# Patient Record
Sex: Female | Born: 1995 | Race: Black or African American | Hispanic: Yes | Marital: Single | Smoking: Never smoker
Health system: Southern US, Community
[De-identification: ages and names within clinical notes are randomized; demographics above are authoritative.]

## PROBLEM LIST (undated history)

## (undated) DIAGNOSIS — R569 Unspecified convulsions: Secondary | ICD-10-CM

## (undated) DIAGNOSIS — G43909 Migraine, unspecified, not intractable, without status migrainosus: Secondary | ICD-10-CM

## (undated) DIAGNOSIS — N809 Endometriosis, unspecified: Secondary | ICD-10-CM

## (undated) DIAGNOSIS — D259 Leiomyoma of uterus, unspecified: Secondary | ICD-10-CM

## (undated) DIAGNOSIS — J45909 Unspecified asthma, uncomplicated: Secondary | ICD-10-CM

## (undated) DIAGNOSIS — N920 Excessive and frequent menstruation with regular cycle: Secondary | ICD-10-CM

## (undated) DIAGNOSIS — Z87898 Personal history of other specified conditions: Secondary | ICD-10-CM

## (undated) HISTORY — DX: Endometriosis, unspecified: N80.9

## (undated) HISTORY — PX: WISDOM TOOTH EXTRACTION: SHX21

---
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
## 2014-01-18 ENCOUNTER — Emergency Department (INDEPENDENT_AMBULATORY_CARE_PROVIDER_SITE_OTHER)
Admission: EM | Admit: 2014-01-18 | Discharge: 2014-01-18 | Disposition: A | Discharge status: Home / Self Care | Payer: Self-pay | Source: Home / Self Care | Attending: Family Medicine | Admitting: Family Medicine

## 2014-01-18 ENCOUNTER — Encounter (HOSPITAL_COMMUNITY): Payer: Self-pay | Admitting: Emergency Medicine

## 2014-01-18 ENCOUNTER — Other Ambulatory Visit (HOSPITAL_COMMUNITY)
Admission: RE | Admit: 2014-01-18 | Discharge: 2014-01-18 | Disposition: A | Discharge status: Home / Self Care | Payer: Medicaid Other | Source: Ambulatory Visit | Attending: Emergency Medicine | Admitting: Emergency Medicine

## 2014-01-18 DIAGNOSIS — N760 Acute vaginitis: Secondary | ICD-10-CM | POA: Insufficient documentation

## 2014-01-18 DIAGNOSIS — Z113 Encounter for screening for infections with a predominantly sexual mode of transmission: Secondary | ICD-10-CM | POA: Insufficient documentation

## 2014-01-18 LAB — POCT URINALYSIS DIP (DEVICE)
Bilirubin Urine: NEGATIVE
Glucose, UA: NEGATIVE mg/dL
Hgb urine dipstick: NEGATIVE
Ketones, ur: NEGATIVE mg/dL
Leukocytes, UA: NEGATIVE
Nitrite: NEGATIVE
Protein, ur: NEGATIVE mg/dL
Specific Gravity, Urine: 1.015 (ref 1.005–1.030)
Urobilinogen, UA: 0.2 mg/dL (ref 0.0–1.0)
pH: 8.5 — ABNORMAL HIGH (ref 5.0–8.0)

## 2014-01-18 LAB — POCT PREGNANCY, URINE: Preg Test, Ur: NEGATIVE

## 2014-01-18 MED ORDER — FLUCONAZOLE 150 MG PO TABS: 150.0000 mg | ORAL_TABLET | Freq: Once | ORAL | Status: DC

## 2014-01-18 NOTE — ED Provider Notes (Signed)
Medical screening examination/treatment/procedure(s) were performed by non-physician practitioner and as supervising physician I was immediately available for consultation/collaboration.  Philipp Deputy, M.D.  Harden Mo, MD 01/18/14 2224

## 2014-01-18 NOTE — ED Notes (Signed)
Patient complains itching with discharge from vagina that started 2 weeks ago.

## 2014-01-18 NOTE — ED Provider Notes (Signed)
CSN: [id number]     Arrival date & time 01/18/14  1643 History   First MD Initiated Contact with Patient 01/18/14 1801     Chief Complaint  Patient presents with  . Vaginitis   (Consider location/radiation/quality/duration/timing/severity/associated sxs/prior Treatment) HPI Comments: 18 year old female presents complaining of vaginal discharge and irritation for the past 2 weeks. She has had a yeast infection in the past as well as BV, she is not sure which this might be. She denies any risk for STDs. No abdominal pain. She is sexually active. No pelvic pain   Past Medical History  Diagnosis Date  . Asthma    History reviewed. No pertinent past surgical history. No family history on file. History  Substance Use Topics  . Smoking status: Never Smoker   . Smokeless tobacco: Not on file  . Alcohol Use: No   OB History   Grav Para Term Preterm Abortions TAB SAB Ect Mult Living                 Review of Systems  Genitourinary: Positive for vaginal discharge.       Vaginal irritation    Allergies  Review of patient's allergies indicates no known allergies.  Home Medications   Prior to Admission medications   Medication Sig Start Date End Date Taking? Authorizing Provider  albuterol (PROVENTIL HFA;VENTOLIN HFA) 108 (90 BASE) MCG/ACT inhaler Inhale into the lungs every 6 (six) hours as needed for wheezing or shortness of breath.   Yes Historical Provider, MD  fluconazole (DIFLUCAN) 150 MG tablet Take 1 tablet (150 mg total) by mouth once. Pick up the refill and and take second dose in 5 days if symptoms have not resolved 01/18/14   Liam Graham, PA-C   BP 126/67  Pulse 64  Temp(Src) 97.9 F (36.6 C) (Oral)  Resp 12  SpO2 100%  LMP 12/22/2013 Physical Exam  Nursing note and vitals reviewed. Constitutional: She is oriented to person, place, and time. Vital signs are normal. She appears well-developed and well-nourished. No distress.  HENT:  Head: Normocephalic and  atraumatic.  Pulmonary/Chest: Effort normal. No respiratory distress.  Abdominal: Soft. She exhibits no mass. There is no tenderness. There is no rebound and no guarding.  Genitourinary: There is no tenderness or lesion on the right labia. There is no tenderness or lesion on the left labia. Cervix exhibits no discharge and no friability. No tenderness or bleeding around the vagina. Vaginal discharge (thin white and clumpy) found.  Lymphadenopathy:       Right: No inguinal adenopathy present.       Left: No inguinal adenopathy present.  Neurological: She is alert and oriented to person, place, and time. She has normal strength. Coordination normal.  Skin: Skin is warm and dry. No rash noted. She is not diaphoretic.  Psychiatric: She has a normal mood and affect. Judgment normal.    ED Course  Procedures (including critical care time) Labs Review Labs Reviewed  POCT URINALYSIS DIP (DEVICE) - Abnormal; Notable for the following:    pH 8.5 (*)    All other components within normal limits  POCT PREGNANCY, URINE  CERVICOVAGINAL ANCILLARY ONLY    Imaging Review No results found.   MDM   1. Vaginitis    Treat for yeast.  Labs sent.  F/u PRN    Discharge Medication List as of 01/18/2014  6:42 PM    START taking these medications   Details  fluconazole (DIFLUCAN) 150 MG tablet Take 1 tablet (  150 mg total) by mouth once. Pick up the refill and and take second dose in 5 days if symptoms have not resolved, Starting 01/18/2014, Print         Liam Graham, PA-C 01/18/14 2146

## 2014-01-18 NOTE — Discharge Instructions (Signed)
Vaginitis Vaginitis is an inflammation of the vagina. It is most often caused by a change in the normal balance of the bacteria and yeast that live in the vagina. This change in balance causes an overgrowth of certain bacteria or yeast, which causes the inflammation. There are different types of vaginitis, but the most common types are:  Bacterial vaginosis.  Yeast infection (candidiasis).  Trichomoniasis vaginitis. This is a sexually transmitted infection (STI).  Viral vaginitis.  Atropic vaginitis.  Allergic vaginitis. CAUSES  The cause depends on the type of vaginitis. Vaginitis can be caused by:  Bacteria (bacterial vaginosis).  Yeast (yeast infection).  A parasite (trichomoniasis vaginitis)  A virus (viral vaginitis).  Low hormone levels (atrophic vaginitis). Low hormone levels can occur during pregnancy, breastfeeding, or after menopause.  Irritants, such as bubble baths, scented tampons, and feminine sprays (allergic vaginitis). Other factors can change the normal balance of the yeast and bacteria that live in the vagina. These include:  Antibiotic medicines.  Poor hygiene.  Diaphragms, vaginal sponges, spermicides, birth control pills, and intrauterine devices (IUD).  Sexual intercourse.  Infection.  Uncontrolled diabetes.  A weakened immune system. SYMPTOMS  Symptoms can vary depending on the cause of the vaginitis. Common symptoms include:  Abnormal vaginal discharge.  The discharge is white, gray, or yellow with bacterial vaginosis.  The discharge is thick, white, and cheesy with a yeast infection.  The discharge is frothy and yellow or greenish with trichomoniasis.  A bad vaginal odor.  The odor is fishy with bacterial vaginosis.  Vaginal itching, pain, or swelling.  Painful intercourse.  Pain or burning when urinating. Sometimes, there are no symptoms. TREATMENT  Treatment will vary depending on the type of infection.   Bacterial  vaginosis and trichomoniasis are often treated with antibiotic creams or pills.  Yeast infections are often treated with antifungal medicines, such as vaginal creams or suppositories.  Viral vaginitis has no cure, but symptoms can be treated with medicines that relieve discomfort. Your sexual partner should be treated as well.  Atrophic vaginitis may be treated with an estrogen cream, pill, suppository, or vaginal ring. If vaginal dryness occurs, lubricants and moisturizing creams may help. You may be told to avoid scented soaps, sprays, or douches.  Allergic vaginitis treatment involves quitting the use of the product that is causing the problem. Vaginal creams can be used to treat the symptoms. HOME CARE INSTRUCTIONS   Take all medicines as directed by your caregiver.  Keep your genital area clean and dry. Avoid soap and only rinse the area with water.  Avoid douching. It can remove the healthy bacteria in the vagina.  Do not use tampons or have sexual intercourse until your vaginitis has been treated. Use sanitary pads while you have vaginitis.  Wipe from front to back. This avoids the spread of bacteria from the rectum to the vagina.  Let air reach your genital area.  Wear cotton underwear to decrease moisture buildup.  Avoid wearing underwear while you sleep until your vaginitis is gone.  Avoid tight pants and underwear or nylons without a cotton panel.  Take off wet clothing (especially bathing suits) as soon as possible.  Use mild, non-scented products. Avoid using irritants, such as:  Scented feminine sprays.  Fabric softeners.  Scented detergents.  Scented tampons.  Scented soaps or bubble baths.  Practice safe sex and use condoms. Condoms may prevent the spread of trichomoniasis and viral vaginitis. SEEK MEDICAL CARE IF:   You have abdominal pain.  You   have a fever or persistent symptoms for more than 2 3 days.  You have a fever and your symptoms suddenly  get worse. Document Released: 06/02/2007 Document Revised: 04/29/2012 Document Reviewed: 01/16/2012 Exit[employer] Patient Information 2014 [employer].  

## 2014-01-20 NOTE — ED Notes (Signed)
Final report of GC / STD testing available for review. Positive for yeast. Called patient, and discussed report, and advised to uses diflucan as directed

## 2015-09-28 ENCOUNTER — Emergency Department (HOSPITAL_COMMUNITY)
Admission: EM | Admit: 2015-09-28 | Discharge: 2015-09-28 | Disposition: A | Discharge status: Home / Self Care | Payer: BLUE CROSS/BLUE SHIELD | Attending: Emergency Medicine | Admitting: Emergency Medicine

## 2015-09-28 ENCOUNTER — Encounter (HOSPITAL_COMMUNITY): Payer: Self-pay

## 2015-09-28 DIAGNOSIS — R51 Headache: Secondary | ICD-10-CM | POA: Diagnosis present

## 2015-09-28 DIAGNOSIS — J45909 Unspecified asthma, uncomplicated: Secondary | ICD-10-CM | POA: Diagnosis not present

## 2015-09-28 DIAGNOSIS — G43809 Other migraine, not intractable, without status migrainosus: Secondary | ICD-10-CM | POA: Diagnosis not present

## 2015-09-28 DIAGNOSIS — Z79899 Other long term (current) drug therapy: Secondary | ICD-10-CM | POA: Insufficient documentation

## 2015-09-28 HISTORY — DX: Migraine, unspecified, not intractable, without status migrainosus: G43.909

## 2015-09-28 MED ORDER — SODIUM CHLORIDE 0.9 % IV SOLN
1000.0000 mL | INTRAVENOUS | Status: DC
  Administered 2015-09-28: 1000 mL via INTRAVENOUS

## 2015-09-28 MED ORDER — METOCLOPRAMIDE HCL 5 MG/ML IJ SOLN
10.0000 mg | Freq: Once | INTRAMUSCULAR | Status: AC
  Administered 2015-09-28: 10 mg via INTRAVENOUS
  Filled 2015-09-28: qty 2

## 2015-09-28 MED ORDER — SODIUM CHLORIDE 0.9 % IV SOLN
1000.0000 mL | Freq: Once | INTRAVENOUS | Status: AC
  Administered 2015-09-28: 1000 mL via INTRAVENOUS

## 2015-09-28 MED ORDER — IBUPROFEN 800 MG PO TABS: 800.0000 mg | ORAL_TABLET | Freq: Four times a day (QID) | ORAL | Status: DC | PRN

## 2015-09-28 MED ORDER — DIPHENHYDRAMINE HCL 25 MG PO TABS
25.0000 mg | ORAL_TABLET | Freq: Four times a day (QID) | ORAL | Status: DC | PRN
Start: 2015-09-28 — End: 2017-06-17

## 2015-09-28 MED ORDER — KETOROLAC TROMETHAMINE 30 MG/ML IJ SOLN
30.0000 mg | Freq: Once | INTRAMUSCULAR | Status: AC
  Administered 2015-09-28: 30 mg via INTRAVENOUS
  Filled 2015-09-28: qty 1

## 2015-09-28 MED ORDER — ONDANSETRON HCL 4 MG/2ML IJ SOLN
4.0000 mg | Freq: Once | INTRAMUSCULAR | Status: AC | PRN
  Administered 2015-09-28: 4 mg via INTRAVENOUS
  Filled 2015-09-28: qty 2

## 2015-09-28 MED ORDER — METOCLOPRAMIDE HCL 10 MG PO TABS: 10.0000 mg | ORAL_TABLET | Freq: Four times a day (QID) | ORAL | Status: DC | PRN

## 2015-09-28 MED ORDER — DIPHENHYDRAMINE HCL 50 MG/ML IJ SOLN
25.0000 mg | Freq: Once | INTRAMUSCULAR | Status: AC
  Administered 2015-09-28: 25 mg via INTRAVENOUS
  Filled 2015-09-28: qty 1

## 2015-09-28 NOTE — ED Provider Notes (Signed)
CSN: [id number]     Arrival date & time 09/28/15  0429 History   First MD Initiated Contact with Patient 09/28/15 [id number]     Chief Complaint  Patient presents with  . Headache  . Emesis     (Consider location/radiation/quality/duration/timing/severity/associated sxs/prior Treatment) HPI Patient reports sudden severe headache behind her right eye. Pain is sharp. She states that she got nausea and vomited twice. She reports that the vision was blurry when the headache started now has improved. She reports she has had some recent cold symptoms. No fevers or sore throat. Patient reports a history of migraine headaches for about 4 years. They are typically frontal. She states they're not usually this severe however. No associated neurologic symptoms or dysfunction. Past Medical History  Diagnosis Date  . Asthma   . Migraines    History reviewed. No pertinent past surgical history. No family history on file. Social History  Substance Use Topics  . Smoking status: Never Smoker   . Smokeless tobacco: None  . Alcohol Use: No   OB History    No data available     Review of Systems  10 Systems reviewed and are negative for acute change except as noted in the HPI.   Allergies  Review of patient's allergies indicates no known allergies.  Home Medications   Prior to Admission medications   Medication Sig Start Date End Date Taking? Authorizing Provider  albuterol (PROVENTIL HFA;VENTOLIN HFA) 108 (90 BASE) MCG/ACT inhaler Inhale into the lungs every 6 (six) hours as needed for wheezing or shortness of breath.   Yes Historical Provider, MD  diphenhydrAMINE (BENADRYL) 25 MG tablet Take 1 tablet (25 mg total) by mouth every 6 (six) hours as needed. With Reglan and ibuprofen for migraine headache. 09/28/15   Charlesetta Shanks, MD  fluconazole (DIFLUCAN) 150 MG tablet Take 1 tablet (150 mg total) by mouth once. Pick up the refill and and take second dose in 5 days if symptoms have not  resolved Patient not taking: Reported on 09/28/2015 01/18/14   Liam Graham, PA-C  ibuprofen (ADVIL,MOTRIN) 800 MG tablet Take 1 tablet (800 mg total) by mouth every 6 (six) hours as needed. 09/28/15   Charlesetta Shanks, MD  metoCLOPramide (REGLAN) 10 MG tablet Take 1 tablet (10 mg total) by mouth every 6 (six) hours as needed for nausea. With ibuprofen and benadryl for migraine 09/28/15   Charlesetta Shanks, MD   BP 132/73 mmHg  Pulse 74  Temp(Src) 98.1 F (36.7 C) (Oral)  Resp 16  Ht 5\' 4"  (1.626 m)  Wt 183 lb (83.008 kg)  BMI 31.40 kg/m2  SpO2 99%  LMP 09/01/2015 Physical Exam  Constitutional: She is oriented to person, place, and time. She appears well-developed and well-nourished.  Patient is alert and nontoxic. She is well appearance.  HENT:  Head: Normocephalic and atraumatic.  Right Ear: External ear normal.  Left Ear: External ear normal.  Nose: Nose normal.  Mouth/Throat: Oropharynx is clear and moist.  TMs normal.  Eyes: EOM are normal. Pupils are equal, round, and reactive to light.  No proptosis or scleral injection. Normal extraocular motions. Normal pupillary responses. Patient endorses tenderness to palpation in the brow and temple and the period or frontal region. No palpable abnormality  Neck: Neck supple.  Cardiovascular: Normal rate, regular rhythm, normal heart sounds and intact distal pulses.   Pulmonary/Chest: Effort normal and breath sounds normal.  Abdominal: Soft. Bowel sounds are normal. She exhibits no distension. There is no tenderness.  Musculoskeletal: Normal range of motion. She exhibits no edema.  Neurological: She is alert and oriented to person, place, and time. She has normal strength. No cranial nerve deficit. She exhibits normal muscle tone. Coordination normal. GCS eye subscore is 4. GCS verbal subscore is 5. GCS motor subscore is 6.  Skin: Skin is warm, dry and intact.  Psychiatric: She has a normal mood and affect.    ED Course  Procedures (including  critical care time) Labs Review Labs Reviewed - No data to display  Imaging Review No results found. I have personally reviewed and evaluated these images and lab results as part of my medical decision-making.   EKG Interpretation None     Recheck: 10:15 headache has resolved with migraine cocktail. She reports feeling much improved. MDM   Final diagnoses:  Other migraine without status migrainosus, not intractable   She presents with headache that is unilateral and sharp in nature. There is no associated neurologic dysfunction. Patient has prior history of migraines with frontal headache however she felt this was more severe than her typical headache. Her physical examination is normal. She is alert and well with no neurologic dysfunction. Eye examination is for normal pupillary responses, no injection and normal extraocular motions. She reports after the initial onset of headache her blurred vision resolved and vision normalized. No visual defect at this time. Patient does not have hypertension. She shows no signs of infectious illness. He described minor cold symptoms however there is no facial swelling or erythema and she has completely nontoxic appearance. ENT exam is normal. Patient had complete symptom resolution with migraine cocktail.    Charlesetta Shanks, MD 09/28/15 1034

## 2015-09-28 NOTE — Discharge Instructions (Signed)

## 2015-09-28 NOTE — ED Notes (Signed)
Pt here for headache that started at 0100 and vomiting with it. Has never had vomiting with headache before.

## 2015-12-21 DIAGNOSIS — N760 Acute vaginitis: Secondary | ICD-10-CM | POA: Diagnosis not present

## 2016-06-28 DIAGNOSIS — N899 Noninflammatory disorder of vagina, unspecified: Secondary | ICD-10-CM | POA: Diagnosis not present

## 2016-08-28 DIAGNOSIS — B882 Other arthropod infestations: Secondary | ICD-10-CM | POA: Diagnosis not present

## 2016-09-17 DIAGNOSIS — Z23 Encounter for immunization: Secondary | ICD-10-CM | POA: Diagnosis not present

## 2016-09-19 DIAGNOSIS — B373 Candidiasis of vulva and vagina: Secondary | ICD-10-CM | POA: Diagnosis not present

## 2016-09-19 DIAGNOSIS — Z118 Encounter for screening for other infectious and parasitic diseases: Secondary | ICD-10-CM | POA: Diagnosis not present

## 2016-09-19 DIAGNOSIS — Z113 Encounter for screening for infections with a predominantly sexual mode of transmission: Secondary | ICD-10-CM | POA: Diagnosis not present

## 2016-09-19 DIAGNOSIS — Z114 Encounter for screening for human immunodeficiency virus [HIV]: Secondary | ICD-10-CM | POA: Diagnosis not present

## 2016-09-19 DIAGNOSIS — K921 Melena: Secondary | ICD-10-CM | POA: Diagnosis not present

## 2016-09-20 DIAGNOSIS — B373 Candidiasis of vulva and vagina: Secondary | ICD-10-CM | POA: Diagnosis not present

## 2016-09-20 DIAGNOSIS — K921 Melena: Secondary | ICD-10-CM | POA: Diagnosis not present

## 2016-11-28 ENCOUNTER — Other Ambulatory Visit: Payer: Self-pay | Admitting: Family

## 2016-11-28 DIAGNOSIS — N939 Abnormal uterine and vaginal bleeding, unspecified: Secondary | ICD-10-CM

## 2016-11-28 DIAGNOSIS — Z113 Encounter for screening for infections with a predominantly sexual mode of transmission: Secondary | ICD-10-CM | POA: Diagnosis not present

## 2016-12-03 ENCOUNTER — Ambulatory Visit
Admission: RE | Admit: 2016-12-03 | Discharge: 2016-12-03 | Disposition: A | Discharge status: Home / Self Care | Payer: BLUE CROSS/BLUE SHIELD | Source: Ambulatory Visit | Attending: Family | Admitting: Family

## 2016-12-03 ENCOUNTER — Other Ambulatory Visit: Payer: BLUE CROSS/BLUE SHIELD

## 2016-12-03 DIAGNOSIS — N939 Abnormal uterine and vaginal bleeding, unspecified: Secondary | ICD-10-CM | POA: Diagnosis not present

## 2017-01-02 DIAGNOSIS — N9489 Other specified conditions associated with female genital organs and menstrual cycle: Secondary | ICD-10-CM | POA: Diagnosis not present

## 2017-01-02 DIAGNOSIS — Z6831 Body mass index (BMI) 31.0-31.9, adult: Secondary | ICD-10-CM | POA: Diagnosis not present

## 2017-01-02 DIAGNOSIS — N920 Excessive and frequent menstruation with regular cycle: Secondary | ICD-10-CM | POA: Diagnosis not present

## 2017-01-02 DIAGNOSIS — D649 Anemia, unspecified: Secondary | ICD-10-CM | POA: Diagnosis not present

## 2017-01-22 DIAGNOSIS — D250 Submucous leiomyoma of uterus: Secondary | ICD-10-CM | POA: Diagnosis not present

## 2017-01-22 DIAGNOSIS — N840 Polyp of corpus uteri: Secondary | ICD-10-CM | POA: Diagnosis not present

## 2017-01-22 DIAGNOSIS — N920 Excessive and frequent menstruation with regular cycle: Secondary | ICD-10-CM | POA: Diagnosis not present

## 2017-02-16 HISTORY — PX: OTHER SURGICAL HISTORY: SHX169

## 2017-02-17 DIAGNOSIS — N920 Excessive and frequent menstruation with regular cycle: Secondary | ICD-10-CM | POA: Diagnosis not present

## 2017-03-11 DIAGNOSIS — Z3202 Encounter for pregnancy test, result negative: Secondary | ICD-10-CM | POA: Diagnosis not present

## 2017-03-11 DIAGNOSIS — N920 Excessive and frequent menstruation with regular cycle: Secondary | ICD-10-CM | POA: Diagnosis not present

## 2017-03-11 DIAGNOSIS — Z30430 Encounter for insertion of intrauterine contraceptive device: Secondary | ICD-10-CM | POA: Diagnosis not present

## 2017-03-11 DIAGNOSIS — N840 Polyp of corpus uteri: Secondary | ICD-10-CM | POA: Diagnosis not present

## 2017-03-31 DIAGNOSIS — N898 Other specified noninflammatory disorders of vagina: Secondary | ICD-10-CM | POA: Diagnosis not present

## 2017-03-31 DIAGNOSIS — N760 Acute vaginitis: Secondary | ICD-10-CM | POA: Diagnosis not present

## 2017-03-31 DIAGNOSIS — Z3200 Encounter for pregnancy test, result unknown: Secondary | ICD-10-CM | POA: Diagnosis not present

## 2017-03-31 DIAGNOSIS — R109 Unspecified abdominal pain: Secondary | ICD-10-CM | POA: Diagnosis not present

## 2017-03-31 DIAGNOSIS — Z09 Encounter for follow-up examination after completed treatment for conditions other than malignant neoplasm: Secondary | ICD-10-CM | POA: Diagnosis not present

## 2017-03-31 DIAGNOSIS — D259 Leiomyoma of uterus, unspecified: Secondary | ICD-10-CM | POA: Diagnosis not present

## 2017-05-20 DIAGNOSIS — Z7251 High risk heterosexual behavior: Secondary | ICD-10-CM | POA: Diagnosis not present

## 2017-05-20 DIAGNOSIS — N938 Other specified abnormal uterine and vaginal bleeding: Secondary | ICD-10-CM | POA: Diagnosis not present

## 2017-06-03 DIAGNOSIS — D259 Leiomyoma of uterus, unspecified: Secondary | ICD-10-CM | POA: Diagnosis not present

## 2017-06-03 DIAGNOSIS — N926 Irregular menstruation, unspecified: Secondary | ICD-10-CM | POA: Diagnosis not present

## 2017-06-03 DIAGNOSIS — N920 Excessive and frequent menstruation with regular cycle: Secondary | ICD-10-CM | POA: Diagnosis not present

## 2017-06-03 DIAGNOSIS — Z30431 Encounter for routine checking of intrauterine contraceptive device: Secondary | ICD-10-CM | POA: Diagnosis not present

## 2017-06-09 ENCOUNTER — Encounter (HOSPITAL_COMMUNITY): Payer: Self-pay | Admitting: Emergency Medicine

## 2017-06-09 DIAGNOSIS — Z975 Presence of (intrauterine) contraceptive device: Secondary | ICD-10-CM | POA: Diagnosis not present

## 2017-06-09 DIAGNOSIS — N939 Abnormal uterine and vaginal bleeding, unspecified: Secondary | ICD-10-CM | POA: Insufficient documentation

## 2017-06-09 DIAGNOSIS — R109 Unspecified abdominal pain: Secondary | ICD-10-CM | POA: Diagnosis not present

## 2017-06-09 DIAGNOSIS — J45909 Unspecified asthma, uncomplicated: Secondary | ICD-10-CM | POA: Diagnosis not present

## 2017-06-09 DIAGNOSIS — D259 Leiomyoma of uterus, unspecified: Secondary | ICD-10-CM | POA: Insufficient documentation

## 2017-06-09 LAB — COMPREHENSIVE METABOLIC PANEL
ALK PHOS: 50 U/L (ref 38–126)
ALT: 13 U/L — ABNORMAL LOW (ref 14–54)
ANION GAP: 8 (ref 5–15)
AST: 20 U/L (ref 15–41)
Albumin: 3.7 g/dL (ref 3.5–5.0)
BILIRUBIN TOTAL: 1.7 mg/dL — AB (ref 0.3–1.2)
BUN: 5 mg/dL — ABNORMAL LOW (ref 6–20)
CALCIUM: 8.7 mg/dL — AB (ref 8.9–10.3)
CO2: 25 mmol/L (ref 22–32)
Chloride: 104 mmol/L (ref 101–111)
Creatinine, Ser: 0.82 mg/dL (ref 0.44–1.00)
GFR calc Af Amer: >60 mL/min (ref >60)
Glucose, Bld: 148 mg/dL — ABNORMAL HIGH (ref 65–99)
POTASSIUM: 3.4 mmol/L — AB (ref 3.5–5.1)
Sodium: 137 mmol/L (ref 135–145)
TOTAL PROTEIN: 6.7 g/dL (ref 6.5–8.1)

## 2017-06-09 LAB — URINALYSIS, ROUTINE W REFLEX MICROSCOPIC
BACTERIA UA: NONE SEEN
BILIRUBIN URINE: NEGATIVE
GLUCOSE, UA: NEGATIVE mg/dL
Ketones, ur: NEGATIVE mg/dL
Nitrite: NEGATIVE
Protein, ur: NEGATIVE mg/dL
SPECIFIC GRAVITY, URINE: 1.012 (ref 1.005–1.030)
pH: 6 (ref 5.0–8.0)

## 2017-06-09 LAB — CBC
HCT: 38.1 % (ref 36.0–46.0)
HEMOGLOBIN: 13.1 g/dL (ref 12.0–15.0)
MCH: 30 pg (ref 26.0–34.0)
MCHC: 34.4 g/dL (ref 30.0–36.0)
MCV: 87.2 fL (ref 78.0–100.0)
Platelets: 280 10*3/uL (ref 150–400)
RBC: 4.37 MIL/uL (ref 3.87–5.11)
RDW: 12.2 % (ref 11.5–15.5)
WBC: 9.4 10*3/uL (ref 4.0–10.5)

## 2017-06-09 LAB — I-STAT BETA HCG BLOOD, ED (MC, WL, AP ONLY)

## 2017-06-09 LAB — LIPASE, BLOOD: Lipase: 33 U/L (ref 11–51)

## 2017-06-09 NOTE — ED Triage Notes (Signed)
Pt c/o heavy vaginal bleeding x 2 days. Reports having to use 1 pad/tampon per hour. Pt had an IUD placed in July and started on oral contraceptives at the beginning of October. Hx endometriosis and fibroids.

## 2017-06-10 ENCOUNTER — Emergency Department (HOSPITAL_COMMUNITY)
Admission: EM | Admit: 2017-06-10 | Discharge: 2017-06-10 | Disposition: A | Discharge status: Home / Self Care | Payer: BLUE CROSS/BLUE SHIELD | Attending: Emergency Medicine | Admitting: Emergency Medicine

## 2017-06-10 DIAGNOSIS — N939 Abnormal uterine and vaginal bleeding, unspecified: Secondary | ICD-10-CM

## 2017-06-10 NOTE — ED Notes (Signed)
Patient able to ambulate independently  

## 2017-06-10 NOTE — ED Provider Notes (Signed)
[facility] EMERGENCY DEPARTMENT Provider Note   CSN: [id number] Arrival date & time: 06/09/17  1905     History   Chief Complaint Chief Complaint  Patient presents with  . Vaginal Bleeding    HPI Ann Fields is a 21 y.o. female.  The history is provided by the patient and medical records.    21 year old female with history of asthma, migraines, uterine fibroids and dysfunctional uterine bleeding, presenting to the ED with vaginal bleeding. States her menstrual cycle started on Saturday and has been heavier than normal. She reports she is using a pad or tampon every hour. States large amount of blood in the commode with some clots intermixed. She does report some lower abdominal cramping which is typical during her cycle. States she has been following with her GYN about this, currently has IUD which was supposed to control the bleeding, however reports it actually made it worse. States they also put her on oral birth control pills, this is her first cycle while taking the and feels it is worse than ever. She did call her gynecologist last week about her options as these regimens do not seem to be working, she was told she could get her IUD taken out but she did not want to pursue that at that time. She's not had any fever or chills. No urinary symptoms. No vaginal discharge. She is not had any dizziness, lightheadedness, or syncopal events.  Past Medical History:  Diagnosis Date  . Asthma   . Migraines     There are no active problems to display for this patient.   History reviewed. No pertinent surgical history.  OB History    No data available       Home Medications    Prior to Admission medications   Medication Sig Start Date End Date Taking? Authorizing Provider  albuterol (PROVENTIL HFA;VENTOLIN HFA) 108 (90 BASE) MCG/ACT inhaler Inhale into the lungs every 6 (six) hours as needed for wheezing or shortness of breath.    [provider]    diphenhydrAMINE (BENADRYL) 25 MG tablet Take 1 tablet (25 mg total) by mouth every 6 (six) hours as needed. With Reglan and ibuprofen for migraine headache. 09/28/15   Charlesetta Shanks, MD  fluconazole (DIFLUCAN) 150 MG tablet Take 1 tablet (150 mg total) by mouth once. Pick up the refill and and take second dose in 5 days if symptoms have not resolved Patient not taking: Reported on 09/28/2015 01/18/14   Liam Graham, PA-C  ibuprofen (ADVIL,MOTRIN) 800 MG tablet Take 1 tablet (800 mg total) by mouth every 6 (six) hours as needed. 09/28/15   Charlesetta Shanks, MD  metoCLOPramide (REGLAN) 10 MG tablet Take 1 tablet (10 mg total) by mouth every 6 (six) hours as needed for nausea. With ibuprofen and benadryl for migraine 09/28/15   Charlesetta Shanks, MD    Family History No family history on file.  Social History Social History  Substance Use Topics  . Smoking status: Never Smoker  . Smokeless tobacco: Never Used  . Alcohol use No     Allergies   Patient has no known allergies.   Review of Systems Review of Systems  Genitourinary: Positive for vaginal bleeding.  All other systems reviewed and are negative.    Physical Exam Updated Vital Signs BP (!) 142/81 (BP Location: Right Arm)   Pulse 77   Temp 98.9 F (37.2 C) (Oral)   Resp 16   Ht 5\' 5"  (1.651 m)  Wt 86.2 kg (190 lb)   SpO2 99%   BMI 31.62 kg/m   Physical Exam  Constitutional: She is oriented to person, place, and time. She appears well-developed and well-nourished.  HENT:  Head: Normocephalic and atraumatic.  Mouth/Throat: Oropharynx is clear and moist.  Eyes: Pupils are equal, round, and reactive to light. Conjunctivae and EOM are normal.  Neck: Normal range of motion.  Cardiovascular: Normal rate, regular rhythm and normal heart sounds.   Pulmonary/Chest: Effort normal and breath sounds normal. No respiratory distress. She has no wheezes.  Abdominal: Soft. Bowel sounds are normal. There is no tenderness. There is no  rebound.  Musculoskeletal: Normal range of motion.  Neurological: She is alert and oriented to person, place, and time.  Skin: Skin is warm and dry.  Psychiatric: She has a normal mood and affect.  Nursing note and vitals reviewed.    ED Treatments / Results  Labs (all labs ordered are listed, but only abnormal results are displayed) Labs Reviewed  COMPREHENSIVE METABOLIC PANEL - Abnormal; Notable for the following:       Result Value   Potassium 3.4 (*)    Glucose, Bld 148 (*)    BUN 5 (*)    Calcium 8.7 (*)    ALT 13 (*)    Total Bilirubin 1.7 (*)    All other components within normal limits  URINALYSIS, ROUTINE W REFLEX MICROSCOPIC - Abnormal; Notable for the following:    Hgb urine dipstick LARGE (*)    Leukocytes, UA TRACE (*)    Squamous Epithelial / LPF 0-5 (*)    All other components within normal limits  LIPASE, BLOOD  CBC  I-STAT BETA HCG BLOOD, ED ([facility], [facility], [facility] ONLY)    EKG  EKG Interpretation None       Radiology No results found.  Procedures Procedures (including critical care time)  Medications Ordered in ED Medications - No data to display   Initial Impression / Assessment and Plan / ED Course  I have reviewed the triage vital signs and the nursing notes.  Pertinent labs & imaging results that were available during my care of the patient were reviewed by me and considered in my medical decision making (see chart for details).  21 year old female here with vaginal bleeding. She has a history of uterine fibroids as well as dysfunctional uterine bleeding. Currently has IUD is also taking oral birth control pills. Saw GYN last week as well. She is afebrile and nontoxic. Hemodynamically stable. Screening labs overall reassuring. H&H is stable. Pregnancy is negative. UA without signs of infection. I discussed with patient that giving this is been a long-standing issue for her that has been previously managed by her GYN, I have limited options for  intervention at this time. She does not have any expressed concern for STD does not want screening test done for this. In lieu of this, we will not perform pelvic exam here-- patient comfortable with this. I strongly encouraged her to follow-up with her GYN for ongoing care. She will call their office in the morning.  School note given.  Discussed plan with patient, she acknowledged understanding and agreed with plan of care.  Return precautions given for new or worsening symptoms.  Final Clinical Impressions(s) / ED Diagnoses   Final diagnoses:  Vaginal bleeding    New Prescriptions New Prescriptions   No medications on file     Larene Pickett, PA-C 06/10/17 6144    Merryl Hacker, MD 06/11/17 [phone]

## 2017-06-10 NOTE — Discharge Instructions (Signed)
Follow-up with your OB-GYN. Return here for any new concerns.

## 2017-06-17 ENCOUNTER — Encounter: Payer: Self-pay | Admitting: Gynecology

## 2017-06-17 ENCOUNTER — Ambulatory Visit (INDEPENDENT_AMBULATORY_CARE_PROVIDER_SITE_OTHER): Payer: BLUE CROSS/BLUE SHIELD | Admitting: Gynecology

## 2017-06-17 VITALS — BP 118/76 | Ht 65.0 in | Wt 193.0 lb

## 2017-06-17 DIAGNOSIS — R102 Pelvic and perineal pain: Secondary | ICD-10-CM

## 2017-06-17 DIAGNOSIS — D250 Submucous leiomyoma of uterus: Secondary | ICD-10-CM | POA: Diagnosis not present

## 2017-06-17 DIAGNOSIS — Z30432 Encounter for removal of intrauterine contraceptive device: Secondary | ICD-10-CM | POA: Diagnosis not present

## 2017-06-17 DIAGNOSIS — N926 Irregular menstruation, unspecified: Secondary | ICD-10-CM

## 2017-06-17 NOTE — Patient Instructions (Signed)
Follow up for ultrasound as scheduled 

## 2017-06-17 NOTE — Progress Notes (Signed)
    GENEIEVE DUELL [birth year] [id number]        21 y.o.  G0P0000 new patient who presents complaining of irregular bleeding and pelvic pain.  History of heavier menses starting earlier this year.  Had ultrasound which suggested a 1.3 cm solid hypoechoic mass within the endometrial echo complex with a feeder vessel.  Subsequently had office hysteroscopy with the preoperative diagnosis of endometrial polyp.  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  Endometrial biopsy showed secratory endometrium.  Patient had Liletta IUD placed in July.  She has continued to have irregular bleeding with significant dysmenorrhea since then bleeding for weeks on and.  Recently evaluated within the emergency room.  Hemoglobin 13- hCG and was started on oral contraceptives over the last several weeks.  Patient notes that her bleeding has continued on and off daily.  Reports recent STD screening through clinic at school.  Reports negative Pap smear 2017.  Currently not sexually active.  Past medical history,surgical history, problem list, medications, allergies, family history and social history were all reviewed and documented in the EPIC chart.  Directed ROS with pertinent positives and negatives documented in the history of present illness/assessment and plan.  Exam: Caryn Bee assistant Vitals:   06/17/17 1400  BP: 118/76  Weight: 193 lb (87.5 kg)  Height: 5\' 5"  (1.651 m)   General appearance:  Normal Abdomen soft nontender without masses guarding rebound Pelvic external BUS vagina with bloody discharge.  Cervix grossly normal, IUD string visualized.  Uterus anteverted normal size midline mobile nontender.  Adnexa without masses or tenderness.  Procedure: IUD string was grasped with a [place] forcep and her IUD was removed, shown to the patient and discarded.  Of note the length of string from where I grasped it at the external loss and from the lower portion of the IUD was  short to suggest that the IUD was within at the cervical canal/lower uterine segment  Assessment/Plan:  21 y.o. G0P0000 with irregular bleeding, IUD in place, recent initiation of oral contraceptives to control her bleeding with ultrasound suggesting endometrial defect with subsequent hysteroscopy showing submucous myoma.  Various options were reviewed with the patient and her grandmother.  My recommendation would be to remove the IUD now cycle 1 or 2 months and then to repeat the sonohysterogram to reevaluate the myoma.  If still with significant submucous component and particularly if irregular bleeding continues then proceed with submucous myomectomy.  I had a lengthy discussion as far as myomas, etiology and potential issues.  I also reviewed submucous myoma could have a fertility issue as could hysteroscopic resection with subsequent scarring.  Options of no intervention even if submucous myoma found versus resecting it all reviewed.  At this point patient wants her IUD removed which was accomplished as above, will stay on her oral contraceptives at her choice and monitor her bleeding.  She will follow-up regardless for a sonohysterogram in 1-2 months, sooner if significant bleeding or pain.  Greater than 50% of my time was spent in direct face to face counseling and coordination of care with the patient.     Anastasio Auerbach MD, 2:49 PM 06/17/2017

## 2017-08-17 ENCOUNTER — Ambulatory Visit (HOSPITAL_COMMUNITY)
Admission: EM | Admit: 2017-08-17 | Discharge: 2017-08-17 | Disposition: A | Discharge status: Home / Self Care | Payer: BLUE CROSS/BLUE SHIELD | Attending: Internal Medicine | Admitting: Internal Medicine

## 2017-08-17 ENCOUNTER — Other Ambulatory Visit: Payer: Self-pay

## 2017-08-17 ENCOUNTER — Encounter (HOSPITAL_COMMUNITY): Payer: Self-pay | Admitting: Emergency Medicine

## 2017-08-17 DIAGNOSIS — Z91040 Latex allergy status: Secondary | ICD-10-CM | POA: Diagnosis not present

## 2017-08-17 DIAGNOSIS — J45909 Unspecified asthma, uncomplicated: Secondary | ICD-10-CM | POA: Diagnosis not present

## 2017-08-17 DIAGNOSIS — R300 Dysuria: Secondary | ICD-10-CM

## 2017-08-17 DIAGNOSIS — N3090 Cystitis, unspecified without hematuria: Secondary | ICD-10-CM

## 2017-08-17 DIAGNOSIS — N898 Other specified noninflammatory disorders of vagina: Secondary | ICD-10-CM | POA: Diagnosis not present

## 2017-08-17 DIAGNOSIS — Z3202 Encounter for pregnancy test, result negative: Secondary | ICD-10-CM

## 2017-08-17 DIAGNOSIS — Z8742 Personal history of other diseases of the female genital tract: Secondary | ICD-10-CM | POA: Diagnosis not present

## 2017-08-17 LAB — POCT URINALYSIS DIP (DEVICE)
Bilirubin Urine: NEGATIVE
Glucose, UA: NEGATIVE mg/dL
Ketones, ur: NEGATIVE mg/dL
NITRITE: NEGATIVE
PROTEIN: NEGATIVE mg/dL
Specific Gravity, Urine: 1.025 (ref 1.005–1.030)
Urobilinogen, UA: 0.2 mg/dL (ref 0.0–1.0)
pH: 6 (ref 5.0–8.0)

## 2017-08-17 LAB — POCT PREGNANCY, URINE: PREG TEST UR: NEGATIVE

## 2017-08-17 MED ORDER — ACETAMINOPHEN 325 MG PO TABS
650.0000 mg | ORAL_TABLET | Freq: Once | ORAL | Status: AC
  Administered 2017-08-17: 650 mg via ORAL

## 2017-08-17 MED ORDER — ACYCLOVIR 400 MG PO TABS: 400.0000 mg | ORAL_TABLET | Freq: Three times a day (TID) | ORAL | 0 refills | Status: AC

## 2017-08-17 MED ORDER — CEFTRIAXONE SODIUM 250 MG IJ SOLR
INTRAMUSCULAR | Status: AC
  Filled 2017-08-17: qty 250

## 2017-08-17 MED ORDER — AZITHROMYCIN 250 MG PO TABS
ORAL_TABLET | ORAL | Status: AC
  Filled 2017-08-17: qty 4

## 2017-08-17 MED ORDER — ACETAMINOPHEN 325 MG PO TABS
ORAL_TABLET | ORAL | Status: AC
  Filled 2017-08-17: qty 2

## 2017-08-17 MED ORDER — METRONIDAZOLE 500 MG PO TABS: 500.0000 mg | ORAL_TABLET | Freq: Two times a day (BID) | ORAL | 0 refills | Status: DC

## 2017-08-17 MED ORDER — CEPHALEXIN 500 MG PO CAPS: 500.0000 mg | ORAL_CAPSULE | Freq: Two times a day (BID) | ORAL | 0 refills | Status: AC

## 2017-08-17 MED ORDER — AZITHROMYCIN 250 MG PO TABS
1000.0000 mg | ORAL_TABLET | Freq: Once | ORAL | Status: AC
  Administered 2017-08-17: 1000 mg via ORAL

## 2017-08-17 MED ORDER — CEFTRIAXONE SODIUM 250 MG IJ SOLR
250.0000 mg | Freq: Once | INTRAMUSCULAR | Status: AC
  Administered 2017-08-17: 250 mg via INTRAMUSCULAR

## 2017-08-17 MED ORDER — FLUCONAZOLE 150 MG PO TABS: 150.0000 mg | ORAL_TABLET | Freq: Every day | ORAL | 0 refills | Status: DC

## 2017-08-17 MED ORDER — LIDOCAINE HCL (PF) 1 % IJ SOLN
INTRAMUSCULAR | Status: AC
  Filled 2017-08-17: qty 2

## 2017-08-17 NOTE — ED Notes (Signed)
Urine in lab 

## 2017-08-17 NOTE — ED Triage Notes (Signed)
Vaginal irritation on Thursday, now reports a rash and tissue swelling and vaginal discharge, "watery, yellow, milky type discharge"

## 2017-08-17 NOTE — ED Notes (Signed)
Sent to bathroom for a dirty and clean specimen with instructions

## 2017-08-17 NOTE — ED Provider Notes (Signed)
[place]    CSN: [id number] Arrival date & time: 08/17/17  1811     History   Chief Complaint Chief Complaint  Patient presents with  . Vaginal Discharge    HPI Ann Fields is a 21 y.o. female.   21 year old female comes in for 4-day history of vaginal irritation, discharge.  She has also had dysuria, urinary urgency. No fever, but has had chills today.  Denies abdominal pain, nausea, vomiting.  Denies spotting.  Patient sexually active with one partner, 2 partners in the past month, occasional condom use.  Patient states that she is allergic to latex, and use condoms during recent sexual intercourse, which is when the irritation started.      Past Medical History:  Diagnosis Date  . Asthma   . Endometriosis   . Fibroid   . Migraines     There are no active problems to display for this patient.   Past Surgical History:  Procedure Laterality Date  . liletta     Inserted 03-11-17    OB History    Gravida Para Term Preterm AB Living   0 0 0 0 0 0   SAB TAB Ectopic Multiple Live Births   0 0 0 0 0       Home Medications    Prior to Admission medications   Medication Sig Start Date End Date Taking? Authorizing Provider  acyclovir (ZOVIRAX) 400 MG tablet Take 1 tablet (400 mg total) by mouth 3 (three) times daily for 7 days. 08/17/17 08/24/17  Tasia Catchings, Amy V, PA-C  albuterol (PROVENTIL HFA;VENTOLIN HFA) 108 (90 BASE) MCG/ACT inhaler Inhale into the lungs every 6 (six) hours as needed for wheezing or shortness of breath.    [provider]  cephALEXin (KEFLEX) 500 MG capsule Take 1 capsule (500 mg total) by mouth 2 (two) times daily for 7 days. 08/17/17 08/24/17  Ok Edwards, PA-C  ibuprofen (ADVIL,MOTRIN) 800 MG tablet Take 1 tablet (800 mg total) by mouth every 6 (six) hours as needed. 09/28/15   Charlesetta Shanks, MD    Family History Family History  Problem Relation Age of Onset  . Diabetes Paternal Grandmother     Social History Social  History   Tobacco Use  . Smoking status: Never Smoker  . Smokeless tobacco: Never Used  Substance Use Topics  . Alcohol use: Yes    Comment: Occas  . Drug use: No     Allergies   Patient has no known allergies.   Review of Systems Review of Systems  Reason unable to perform ROS: See HPI as above.     Physical Exam Triage Vital Signs ED Triage Vitals  Enc Vitals Group     BP 08/17/17 2008 (!) 147/90     Pulse Rate 08/17/17 2008 (!) 116     Resp 08/17/17 2008 20     Temp 08/17/17 2008 (!) 101.8 F (38.8 C)     Temp Source 08/17/17 2008 Oral     SpO2 08/17/17 2008 100 %     Weight --      Height --      Head Circumference --      Peak Flow --      Pain Score 08/17/17 2006 9     Pain Loc --      Pain Edu? --      Excl. in [place]? --    No data found.  Updated Vital Signs BP 135/84 (BP Location: Left Arm)  Pulse (!) 115   Temp (!) 102.8 F (39.3 C) (Oral)   Resp (!) 22   LMP 08/10/2017   SpO2 100%   Physical Exam  Constitutional: She is oriented to person, place, and time. She appears well-developed and well-nourished. No distress.  HENT:  Head: Normocephalic and atraumatic.  Eyes: Conjunctivae are normal. Pupils are equal, round, and reactive to light.  Cardiovascular: Normal rate, regular rhythm and normal heart sounds. Exam reveals no gallop and no friction rub.  No murmur heard. Pulmonary/Chest: Effort normal and breath sounds normal. She has no wheezes. She has no rales.  Abdominal: Soft. Bowel sounds are normal. She exhibits no mass. There is no tenderness. There is no rebound, no guarding and no CVA tenderness.  Genitourinary:  Genitourinary Comments: Speculum exam deferred by patient due to pain.  Redness around the labia, with tenderness to palpation.  Fishy odor smelled.  No sores seen.  Neurological: She is alert and oriented to person, place, and time.  Skin: Skin is warm and dry.  Psychiatric: She has a normal mood and affect. Her behavior is  normal. Judgment normal.     UC Treatments / Results  Labs (all labs ordered are listed, but only abnormal results are displayed) Labs Reviewed  POCT URINALYSIS DIP (DEVICE) - Abnormal; Notable for the following components:      Result Value   Hgb urine dipstick TRACE (*)    Leukocytes, UA MODERATE (*)    All other components within normal limits  URINE CULTURE  HSV 1 ANTIBODY, IGG  HSV 2 ANTIBODY, IGG  POCT PREGNANCY, URINE  CERVICOVAGINAL ANCILLARY ONLY    EKG  EKG Interpretation None       Radiology No results found.  Procedures Procedures (including critical care time)  Medications Ordered in UC Medications  acetaminophen (TYLENOL) tablet 650 mg (650 mg Oral Given 08/17/17 2015)  azithromycin (ZITHROMAX) tablet 1,000 mg (1,000 mg Oral Given 08/17/17 2153)  cefTRIAXone (ROCEPHIN) injection 250 mg (250 mg Intramuscular Given 08/17/17 2151)     Initial Impression / Assessment and Plan / UC Course  I have reviewed the triage vital signs and the nursing notes.  Pertinent labs & imaging results that were available during my care of the patient were reviewed by me and considered in my medical decision making (see chart for details).    Urine dipstick positive for UTI. Start antibiotics as directed. Push fluids.   Repeat vitals with continued fever and tachycardia.  Given history and exam, will treat empirically for gonorrhea, chlamydia, herpes.  Azithromycin 1 g p.o. and Rocephin 250 mg IM given in office today.  Start acyclovir as directed.  Cytology and lab work sent, patient will be contacted with any positive results that require additional treatment. Patient to refrain from sexual activity for the next 7 days. Return precautions given.   Discussed case with Dr Valere Dross, who agrees to plan.   Final Clinical Impressions(s) / UC Diagnoses   Final diagnoses:  Cystitis  Vaginal discharge    ED Discharge Orders        Ordered    cephALEXin (KEFLEX) 500 MG  capsule  2 times daily     08/17/17 2038    fluconazole (DIFLUCAN) 150 MG tablet  Daily,   Status:  Discontinued     08/17/17 2038    metroNIDAZOLE (FLAGYL) 500 MG tablet  2 times daily,   Status:  Discontinued     08/17/17 2038    acyclovir (ZOVIRAX) 400 MG tablet  3 times daily     08/17/17 2146        Arturo Morton 08/17/17 2158

## 2017-08-17 NOTE — Discharge Instructions (Addendum)
Your urine was positive for an urinary tract infection. Start Keflex as directed. Keep hydrated, your urine should be clear to pale yellow in color. Monitor for any worsening of symptoms, fever, worsening abdominal pain, nausea/vomiting, flank pain, follow up for reevaluation.   You were treated empirically for gonorrhea, chlamydia, herpes.  Azithromycin 1 g and Rocephin injection 250 mg given in office today.  Start acyclovir as directed.  Testing sent, you will be contacted with any positive results that requires further treatment. Refrain from sexual activity for the next 7 days. Monitor for any worsening of symptoms, fever, abdominal pain, nausea, vomiting, to follow up for reevaluation.

## 2017-08-18 ENCOUNTER — Other Ambulatory Visit: Payer: Self-pay | Admitting: Gynecology

## 2017-08-18 DIAGNOSIS — N939 Abnormal uterine and vaginal bleeding, unspecified: Secondary | ICD-10-CM

## 2017-08-19 LAB — HSV 1 ANTIBODY, IGG: HSV 1 Glycoprotein G Ab, IgG: 1.1 index — ABNORMAL HIGH (ref 0.00–0.90)

## 2017-08-19 LAB — URINE CULTURE

## 2017-08-19 LAB — HSV 2 ANTIBODY, IGG: HSV 2 Glycoprotein G Ab, IgG: <0.91 index (ref 0.00–0.90)

## 2017-08-20 ENCOUNTER — Ambulatory Visit: Payer: BLUE CROSS/BLUE SHIELD | Admitting: Gynecology

## 2017-08-20 ENCOUNTER — Other Ambulatory Visit: Payer: BLUE CROSS/BLUE SHIELD

## 2017-08-20 LAB — CERVICOVAGINAL ANCILLARY ONLY
BACTERIAL VAGINITIS: POSITIVE — AB
Candida vaginitis: NEGATIVE
Chlamydia: NEGATIVE
Neisseria Gonorrhea: NEGATIVE
TRICH (WINDOWPATH): NEGATIVE

## 2017-08-21 ENCOUNTER — Telehealth (HOSPITAL_COMMUNITY): Payer: Self-pay | Admitting: *Deleted

## 2017-10-03 ENCOUNTER — Ambulatory Visit: Payer: BLUE CROSS/BLUE SHIELD | Admitting: Gynecology

## 2017-10-03 ENCOUNTER — Encounter: Payer: Self-pay | Admitting: Gynecology

## 2017-10-03 VITALS — BP 118/76

## 2017-10-03 DIAGNOSIS — A609 Anogenital herpesviral infection, unspecified: Secondary | ICD-10-CM

## 2017-10-03 DIAGNOSIS — N912 Amenorrhea, unspecified: Secondary | ICD-10-CM | POA: Diagnosis not present

## 2017-10-03 LAB — PREGNANCY, URINE: Preg Test, Ur: NEGATIVE

## 2017-10-03 MED ORDER — MEDROXYPROGESTERONE ACETATE 10 MG PO TABS: 10.0000 mg | ORAL_TABLET | Freq: Every day | ORAL | 0 refills | Status: DC

## 2017-10-03 NOTE — Progress Notes (Signed)
    Ann Fields 1996-01-07 [id number]        22 y.o.  G0P0000 presents late for her menses.  History of regular monthly menses through 08/08/2017 and no bleeding since.  She has not had intercourse since December.  Had Mirena IUD which was removed previously.  Was seen at the urgent care in December and was diagnosed with herpes.  Was given a short course of antiviral.  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  She was negative for HSV 2 IgG.  She did note that she had a area of small blisters.  Past medical history,surgical history, problem list, medications, allergies, family history and social history were all reviewed and documented in the EPIC chart.  Directed ROS with pertinent positives and negatives documented in the history of present illness/assessment and plan.  Exam: Caryn Bee assistant Vitals:   10/03/17 1017  BP: 118/76   General appearance:  Normal Abdomen soft nontender without masses guarding rebound Pelvic external BUS vagina normal.  Cervix normal.  Uterus normal size midline mobile nontender.  Adnexa without masses or tenderness.  Assessment/Plan:  22 y.o. G0P0000 with:  1. Skipped menses.  UPT negative today.  Denies intercourse since December.  Will withdrawal with Provera 10 mg x 10 days.  Reviewed the issues of anovulatory cycles.  As this is an isolated event will monitor afterwards and if she continues to have irregular bleeding will re-discuss treatment options.  At this point she is not interested in contraception stating "it does not agree with me".  She understands the risks of pregnancy at least using condoms. 2. 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  Clinically though she does describe areas of blisters which would go along with HSV.  At this point I recommend that she read present if any suspicion of lesions before  inspection and direct PCR testing of the area.  Possible daily suppressive doses also discussed if this becomes a recurrent issue.  Greater than 50% of my time was spent in direct face to face counseling and coordination of care with the patient.     Anastasio Auerbach MD, 10:49 AM 10/03/2017

## 2017-10-03 NOTE — Patient Instructions (Signed)
Take the progesterone medicine daily for 10 days to bring on your menses.  Call me if your menses continue irregular  Follow-up if you have any recurrence of the herpes.

## 2017-10-03 NOTE — Addendum Note (Signed)
Addended by: Nelva Nay on: 10/03/2017 11:12 AM   Modules accepted: Orders

## 2017-10-29 DIAGNOSIS — J029 Acute pharyngitis, unspecified: Secondary | ICD-10-CM | POA: Diagnosis not present

## 2017-12-08 ENCOUNTER — Other Ambulatory Visit: Payer: BLUE CROSS/BLUE SHIELD

## 2017-12-08 ENCOUNTER — Ambulatory Visit: Payer: BLUE CROSS/BLUE SHIELD | Admitting: Gynecology

## 2017-12-17 ENCOUNTER — Encounter: Payer: Self-pay | Admitting: Gynecology

## 2017-12-17 ENCOUNTER — Ambulatory Visit (INDEPENDENT_AMBULATORY_CARE_PROVIDER_SITE_OTHER): Payer: BLUE CROSS/BLUE SHIELD

## 2017-12-17 ENCOUNTER — Ambulatory Visit: Payer: BLUE CROSS/BLUE SHIELD | Admitting: Gynecology

## 2017-12-17 VITALS — BP 124/78

## 2017-12-17 DIAGNOSIS — N840 Polyp of corpus uteri: Secondary | ICD-10-CM

## 2017-12-17 DIAGNOSIS — D250 Submucous leiomyoma of uterus: Secondary | ICD-10-CM

## 2017-12-17 DIAGNOSIS — D261 Other benign neoplasm of corpus uteri: Secondary | ICD-10-CM | POA: Diagnosis not present

## 2017-12-17 DIAGNOSIS — N939 Abnormal uterine and vaginal bleeding, unspecified: Secondary | ICD-10-CM

## 2017-12-17 DIAGNOSIS — N921 Excessive and frequent menstruation with irregular cycle: Secondary | ICD-10-CM

## 2017-12-17 NOTE — Progress Notes (Signed)
    Ann Fields 07-Feb-1996 [id number]        22 y.o.  G0P0000 resents for sonohysterogram.  History of irregular bleeding and pelvic cramping.  Had an ultrasound elsewhere which suggested endometrial mass and ultimately underwent office hysteroscopy which showed a submucous myoma.  Endometrial biopsy was performed which showed secratory endometrium.  Patient follows up now for sonohysterogram due to continued irregular bleeding, pelvic cramping and evidence of endometrial mass on prior evaluation.  Past medical history,surgical history, problem list, medications, allergies, family history and social history were all reviewed and documented in the EPIC chart.  Directed ROS with pertinent positives and negatives documented in the history of present illness/assessment and plan.  Exam: [name] assistant Vitals:   12/17/17 1458  BP: 124/78   General appearance:  Normal Abdomen soft nontender without masses guarding rebound Pelvic external BUS vagina normal.  Cervix normal.  Uterus grossly normal midline mobile nontender.  Adnexa without masses or tenderness.  Ultrasound transvaginal and transabdominal shows uterus normal size and echotexture.  Endometrial echo 18.3 mm.  Vascular hypoechoic focus noted within the endometrium 25 x 18 mm.  Right and left ovaries visualized with physiologic changes.  Cul-de-sac negative.  Sono histogram performed, sterile technique, easy catheter introduction, adequate distention with 21 x 15 x 21 mm mass consistent with a submucous myoma.  A smaller echogenic focus noticed in the lower uterine segment 13 x 6 mm, questionable polyp.  Endometrial sample taken.  Patient tolerated well.  Assessment/Plan:  22 y.o. 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  We discussed options for management and my recommendation to proceed with hysteroscopy D&C with resection of the submucous myoma.  I also reviewed the possible  polyp in the lower uterine segment that we would also address at the same time.  I reviewed with the patient and her mother what is involved with the procedure to include the intraoperative and postoperative courses.  She understands that we may not be able to resect all of the myoma but only that which protrudes into the cavity.  Also discussed the risks to include damage to surrounding structures such as vagina cervix and uterus and perforation leading to internal organ damage such as bowel bladder ureters vessels and nerves necessitating major reparative surgeries to include bowel resection and ostomy formation bladder/ureter and vascular repair.  Infection as well as prolonged antibiotic use also reviewed.  Issues of fertility were discussed and the potential for adverse effects of a submucous myoma with possible benefit from resection as well as the issues of scarring and subsequent difficulties.  The patient wants to move towards scheduling the procedure and she will do this at her convenience.   Anastasio Auerbach MD, 3:40 PM 12/17/2017

## 2017-12-17 NOTE — Patient Instructions (Addendum)
Office will call you with biopsy results from the ultrasound.  Office will call you to arrange for the surgery.

## 2017-12-22 ENCOUNTER — Telehealth: Payer: Self-pay

## 2017-12-22 MED ORDER — MISOPROSTOL 200 MCG PO TABS: ORAL_TABLET | ORAL | 0 refills | Status: DC

## 2017-12-22 NOTE — Telephone Encounter (Signed)
Spoke with patient about scheduling surgery. We reviewed her insurance benefits and her estimated GGA surgery prepymt due by one week prior to surgery.  We discussed upcoming available dates and she chose 5/17 at 8:30am. At [facility]. And was scheduled for that date.  I will mail her a pamphlet from [facility] and a financial letter.  We also discussed the need for a Cytotec tab vaginally night before surgery. Rx was sent. Patient was warned that it might cause some spotting/bleeding night before as well as some menstrual like cramps. No need to worry just means tablet is working.

## 2017-12-23 ENCOUNTER — Encounter: Payer: Self-pay | Admitting: Gynecology

## 2017-12-24 ENCOUNTER — Ambulatory Visit: Payer: BLUE CROSS/BLUE SHIELD | Admitting: Gynecology

## 2017-12-24 ENCOUNTER — Encounter: Payer: Self-pay | Admitting: Gynecology

## 2017-12-24 VITALS — BP 118/74

## 2017-12-24 DIAGNOSIS — N924 Excessive bleeding in the premenopausal period: Secondary | ICD-10-CM

## 2017-12-24 DIAGNOSIS — D250 Submucous leiomyoma of uterus: Secondary | ICD-10-CM

## 2017-12-24 NOTE — Patient Instructions (Signed)
Followup for surgery as scheduled. 

## 2017-12-24 NOTE — H&P (Signed)
Ann Fields [birth year] [id number]   History and Physical  Chief complaint: Pelvic cramping and irregular bleeding with submucous myoma and endometrial polyp  History of present illness: 22 y.o. G0P0000 presents with a history of irregular bleeding and pelvic cramping over this past year.  She underwent evaluation elsewhere where hysteroscopy showed a submucous myoma and endometrial biopsy showed secratory endometrium.  She had a follow-up sonohysterogram which showed a 21 x 15 x 21 mm mass consistent with a submucous myoma and a smaller echogenic focus in the lower uterine segment 13 x 6 mm questionable endometrial polyp.  Patient is admitted for hysteroscopy D&C with resection of the submucous myoma and endometrial polyp as encountered.  Past Medical History:  Diagnosis Date  . Asthma   . Endometriosis   . Fibroid   . Migraines     Past Surgical History:  Procedure Laterality Date  . liletta     Inserted 03-11-17    Family History  Problem Relation Age of Onset  . Diabetes Paternal Grandmother     Social History:  reports that she has never smoked. She has never used smokeless tobacco. She reports that she drinks alcohol. She reports that she does not use drugs.  Allergies:  Allergies  Allergen Reactions  . Latex Rash    Medications: See epic for the latest updated listing  ROS:  Was performed and pertinent positives and negatives are included in the history of present illness.  Exam: HEENT normal Lungs clear Cardiac regular rate without rubs murmurs or gallops Abdomen soft nontender without masses guarding rebound Pelvic 12/17/2017 [name] assistant External BUS vagina normal.  Cervix normal.  Uterus grossly normal size midline mobile nontender.  Adnexa without masses or tenderness    Assessment/Plan:  22 y.o. G0P0000 with heavier irregular menses and ultrasound showing a submucous myoma 21 x 15 x 21 mm.  Also a smaller area in lower uterine segment possibly  representing an endometrial polyp 13 x 6 mm.  Endometrial biopsy was benign.  Patient for planned hysteroscopy D&C with resection of the submucous myoma and endometrial polyp.  I discussed with the patient and her parents the anatomy of the situation and that if the myoma is totally within the cavity that we will resect all of it.  She understands that we will not be able to resect any of the fibroid that remains within the wall of the uterus and that she may have a portion of the myoma left in situ.  We also reviewed the possibilities of creating intrauterine scarring and synechiae which may contribute to infertility or pregnancy issues in the future but also recognizing that a submucous myoma may also contribute to this if left in situ.  I discussed there is no guarantees of procedure will relieve her irregular bleeding or cramping.  I reviewed the proposed surgery with the patient and her parents to include the expected intraoperative and postoperative courses as well as the recovery period. The use of the hysteroscope, resectoscope and the D&C portion were all discussed. The risks of surgery to include infection, prolonged antibiotics, hemorrhage necessitating transfusion and the risks of transfusion, including transfusion reaction, hepatitis, HIV, mad cow disease and other unknown entities were all discussed understood and accepted. The risk of damage to internal organs during the procedure, either immediately recognized or delay recognized, including vagina, cervix, uterus, possible perforation causing damage to bowel, bladder, ureters, vessels and nerves necessitating major exploratory reparative surgery and future reparative surgeries including bladder repair, ureteral  damage repair, bowel resection, ostomy formation was also discussed understood and accepted. The patient's questions were answered to her satisfaction and she is ready to proceed with surgery.      Anastasio Auerbach MD, 12:47 PM  12/24/2017

## 2017-12-24 NOTE — Progress Notes (Addendum)
    Ann Fields 05-Sep-1995 [id number]        22 y.o.  G0P0000 presents with her parents for preoperative visit for upcoming hysteroscopy D&C with resection of her submucous myoma and possible endometrial polyp.  Her history is outlined in her 12/17/2017 note.  Past medical history,surgical history, problem list, medications, allergies, family history and social history were all reviewed and documented in the EPIC chart.  Directed ROS with pertinent positives and negatives documented in the history of present illness/assessment and plan.  Exam: Vitals:   12/24/17 1137  BP: 118/74   General appearance:  Normal HEENT normal Lungs clear Cardiac regular rate without rubs murmurs or gallops Abdomen soft nontender without masses guarding rebound Pelvic deferred noted to be normal 12/17/2017.  Assessment/Plan:  22 y.o. G0P0000 with heavier irregular menses and ultrasound showing a submucous myoma 21 x 15 x 21 mm.  Also a smaller area in the lower uterine segment possibly representing an endometrial polyp 13 x 6 mm.  Endometrial biopsy was benign.  Patient for planned hysteroscopy D&C with resection of the submucous myoma and endometrial polyp.  I discussed with the patient and her parents the anatomy of the situation and that if the myoma is totally within the cavity that we will resect all of it.  She understands that we will not be able to resect any of the fibroid that remains within the wall of the uterus and that she may have a portion of the myoma left in situ.  We also reviewed the possibilities of creating intrauterine scarring and synechiae which may contribute to infertility or pregnancy issues in the future but also recognizing that a submucous myoma may also contribute to this if left in situ.  There is no guarantees of procedure will relieve her irregular bleeding or cramping.  I reviewed the proposed surgery with the patient and her parents to include the expected intraoperative and  postoperative courses as well as the recovery period. The use of the hysteroscope, resectoscope and the D&C portion were all discussed. The risks of surgery to include infection, prolonged antibiotics, hemorrhage necessitating transfusion and the risks of transfusion, including transfusion reaction, hepatitis, HIV, mad cow disease and other unknown entities were all discussed understood and accepted. The risk of damage to internal organs during the procedure, either immediately recognized or delay recognized, including vagina, cervix, uterus, possible perforation causing damage to bowel, bladder, ureters, vessels and nerves necessitating major exploratory reparative surgery and future reparative surgeries including bladder repair, ureteral damage repair, bowel resection, ostomy formation was also discussed understood and accepted. The patient's questions were answered to her satisfaction and she is ready to proceed with surgery.      Anastasio Auerbach MD, 12:41 PM 12/24/2017

## 2017-12-30 ENCOUNTER — Encounter (HOSPITAL_BASED_OUTPATIENT_CLINIC_OR_DEPARTMENT_OTHER): Payer: Self-pay | Admitting: *Deleted

## 2017-12-30 ENCOUNTER — Other Ambulatory Visit: Payer: Self-pay

## 2017-12-30 NOTE — Progress Notes (Signed)
SPOKE W/ PT VIA PHONE FOR PRE-OP INTERVIEW.  NPO AFTER MN.  ARRIVE AT 0630.  GETTING CBC AND SERUM hCG DONE Wednesday 12-31-2017 @ 1300.

## 2017-12-31 ENCOUNTER — Telehealth: Payer: Self-pay

## 2017-12-31 ENCOUNTER — Encounter (HOSPITAL_COMMUNITY)
Admission: RE | Admit: 2017-12-31 | Discharge: 2017-12-31 | Disposition: A | Discharge status: Home / Self Care | Payer: BLUE CROSS/BLUE SHIELD | Source: Ambulatory Visit | Attending: Gynecology | Admitting: Gynecology

## 2017-12-31 DIAGNOSIS — J45909 Unspecified asthma, uncomplicated: Secondary | ICD-10-CM | POA: Diagnosis not present

## 2017-12-31 DIAGNOSIS — N840 Polyp of corpus uteri: Secondary | ICD-10-CM | POA: Diagnosis not present

## 2017-12-31 DIAGNOSIS — D250 Submucous leiomyoma of uterus: Secondary | ICD-10-CM | POA: Diagnosis not present

## 2017-12-31 DIAGNOSIS — N920 Excessive and frequent menstruation with regular cycle: Secondary | ICD-10-CM | POA: Diagnosis not present

## 2017-12-31 DIAGNOSIS — R109 Unspecified abdominal pain: Secondary | ICD-10-CM | POA: Diagnosis not present

## 2017-12-31 DIAGNOSIS — Z01818 Encounter for other preprocedural examination: Secondary | ICD-10-CM | POA: Diagnosis not present

## 2017-12-31 DIAGNOSIS — D649 Anemia, unspecified: Secondary | ICD-10-CM | POA: Diagnosis not present

## 2017-12-31 DIAGNOSIS — Z79899 Other long term (current) drug therapy: Secondary | ICD-10-CM | POA: Diagnosis not present

## 2017-12-31 LAB — CBC
HCT: 33.2 % — ABNORMAL LOW (ref 36.0–46.0)
Hemoglobin: 9.9 g/dL — ABNORMAL LOW (ref 12.0–15.0)
MCH: 21.6 pg — ABNORMAL LOW (ref 26.0–34.0)
MCHC: 29.8 g/dL — ABNORMAL LOW (ref 30.0–36.0)
MCV: 72.5 fL — ABNORMAL LOW (ref 78.0–100.0)
PLATELETS: 252 10*3/uL (ref 150–400)
RBC: 4.58 MIL/uL (ref 3.87–5.11)
RDW: 15.5 % (ref 11.5–15.5)
WBC: 6.9 10*3/uL (ref 4.0–10.5)

## 2017-12-31 LAB — HCG, SERUM, QUALITATIVE: PREG SERUM: NEGATIVE

## 2017-12-31 NOTE — Telephone Encounter (Signed)
Left message for patient to let her know I had returned mail for her today. It was not marked with a sticker telling me why returned.  I explained it was her financial letter and her [facility] pamphlet.  I asked her to call me if she has any questions because now not enough time to mail it to her again as her surgery is 01/02/18.

## 2018-01-02 ENCOUNTER — Telehealth: Payer: Self-pay

## 2018-01-02 ENCOUNTER — Encounter (HOSPITAL_BASED_OUTPATIENT_CLINIC_OR_DEPARTMENT_OTHER): Payer: Self-pay

## 2018-01-02 ENCOUNTER — Encounter (HOSPITAL_COMMUNITY): Payer: Self-pay | Admitting: *Deleted

## 2018-01-02 ENCOUNTER — Encounter (HOSPITAL_BASED_OUTPATIENT_CLINIC_OR_DEPARTMENT_OTHER)
Admission: RE | Disposition: A | Discharge status: Home / Self Care | Payer: Self-pay | Source: Ambulatory Visit | Attending: Gynecology

## 2018-01-02 ENCOUNTER — Ambulatory Visit (HOSPITAL_BASED_OUTPATIENT_CLINIC_OR_DEPARTMENT_OTHER): Payer: BLUE CROSS/BLUE SHIELD | Admitting: Anesthesiology

## 2018-01-02 ENCOUNTER — Inpatient Hospital Stay (EMERGENCY_DEPARTMENT_HOSPITAL)
Admission: AD | Admit: 2018-01-02 | Discharge: 2018-01-02 | Disposition: A | Discharge status: Home / Self Care | Payer: BLUE CROSS/BLUE SHIELD | Source: Ambulatory Visit | Attending: Obstetrics and Gynecology | Admitting: Obstetrics and Gynecology

## 2018-01-02 ENCOUNTER — Ambulatory Visit (HOSPITAL_BASED_OUTPATIENT_CLINIC_OR_DEPARTMENT_OTHER)
Admission: RE | Admit: 2018-01-02 | Discharge: 2018-01-02 | Disposition: A | Discharge status: Home / Self Care | Payer: BLUE CROSS/BLUE SHIELD | Source: Ambulatory Visit | Attending: Gynecology | Admitting: Gynecology

## 2018-01-02 DIAGNOSIS — N920 Excessive and frequent menstruation with regular cycle: Secondary | ICD-10-CM | POA: Insufficient documentation

## 2018-01-02 DIAGNOSIS — R109 Unspecified abdominal pain: Secondary | ICD-10-CM

## 2018-01-02 DIAGNOSIS — D250 Submucous leiomyoma of uterus: Secondary | ICD-10-CM | POA: Insufficient documentation

## 2018-01-02 DIAGNOSIS — J45909 Unspecified asthma, uncomplicated: Secondary | ICD-10-CM | POA: Insufficient documentation

## 2018-01-02 DIAGNOSIS — R1111 Vomiting without nausea: Secondary | ICD-10-CM

## 2018-01-02 DIAGNOSIS — N840 Polyp of corpus uteri: Secondary | ICD-10-CM | POA: Insufficient documentation

## 2018-01-02 DIAGNOSIS — Z79899 Other long term (current) drug therapy: Secondary | ICD-10-CM | POA: Insufficient documentation

## 2018-01-02 DIAGNOSIS — R197 Diarrhea, unspecified: Secondary | ICD-10-CM | POA: Insufficient documentation

## 2018-01-02 DIAGNOSIS — R195 Other fecal abnormalities: Secondary | ICD-10-CM

## 2018-01-02 DIAGNOSIS — D649 Anemia, unspecified: Secondary | ICD-10-CM | POA: Insufficient documentation

## 2018-01-02 DIAGNOSIS — D259 Leiomyoma of uterus, unspecified: Secondary | ICD-10-CM | POA: Diagnosis not present

## 2018-01-02 HISTORY — DX: Excessive and frequent menstruation with regular cycle: N92.0

## 2018-01-02 HISTORY — PX: DILATATION & CURETTAGE/HYSTEROSCOPY WITH MYOSURE: SHX6511

## 2018-01-02 HISTORY — DX: Personal history of other specified conditions: Z87.898

## 2018-01-02 HISTORY — DX: Unspecified asthma, uncomplicated: J45.909

## 2018-01-02 HISTORY — DX: Leiomyoma of uterus, unspecified: D25.9

## 2018-01-02 LAB — URINALYSIS, ROUTINE W REFLEX MICROSCOPIC
Bacteria, UA: NONE SEEN
Bilirubin Urine: NEGATIVE
Glucose, UA: NEGATIVE mg/dL
Ketones, ur: NEGATIVE mg/dL
Leukocytes, UA: NEGATIVE
Nitrite: NEGATIVE
PROTEIN: 30 mg/dL — AB
RBC / HPF: >50 RBC/hpf — ABNORMAL HIGH (ref 0–5)
SPECIFIC GRAVITY, URINE: 1.034 — AB (ref 1.005–1.030)
pH: 5 (ref 5.0–8.0)

## 2018-01-02 LAB — POCT PREGNANCY, URINE: PREG TEST UR: NEGATIVE

## 2018-01-02 SURGERY — DILATATION & CURETTAGE/HYSTEROSCOPY WITH MYOSURE
Anesthesia: General

## 2018-01-02 MED ORDER — DEXAMETHASONE SODIUM PHOSPHATE 10 MG/ML IJ SOLN
INTRAMUSCULAR | Status: AC
  Filled 2018-01-02: qty 1

## 2018-01-02 MED ORDER — LACTATED RINGERS IV SOLN
INTRAVENOUS | Status: DC
  Filled 2018-01-02: qty 1000

## 2018-01-02 MED ORDER — DEXAMETHASONE SODIUM PHOSPHATE 10 MG/ML IJ SOLN
INTRAMUSCULAR | Status: DC | PRN
  Administered 2018-01-02: 10 mg via INTRAVENOUS

## 2018-01-02 MED ORDER — LIDOCAINE HCL 1 % IJ SOLN
INTRAMUSCULAR | Status: DC | PRN
  Administered 2018-01-02: 10 mL

## 2018-01-02 MED ORDER — CEFOTETAN DISODIUM-DEXTROSE 2-2.08 GM-%(50ML) IV SOLR
INTRAVENOUS | Status: AC
  Filled 2018-01-02: qty 50

## 2018-01-02 MED ORDER — MIDAZOLAM HCL 2 MG/2ML IJ SOLN
INTRAMUSCULAR | Status: AC
  Filled 2018-01-02: qty 2

## 2018-01-02 MED ORDER — FENTANYL CITRATE (PF) 100 MCG/2ML IJ SOLN
INTRAMUSCULAR | Status: AC
  Filled 2018-01-02: qty 2

## 2018-01-02 MED ORDER — METOCLOPRAMIDE HCL 5 MG/ML IJ SOLN
10.0000 mg | Freq: Once | INTRAMUSCULAR | Status: DC | PRN
  Filled 2018-01-02: qty 2

## 2018-01-02 MED ORDER — FENTANYL CITRATE (PF) 100 MCG/2ML IJ SOLN
25.0000 ug | INTRAMUSCULAR | Status: DC | PRN
  Filled 2018-01-02: qty 1

## 2018-01-02 MED ORDER — LIDOCAINE 2% (20 MG/ML) 5 ML SYRINGE
INTRAMUSCULAR | Status: AC
  Filled 2018-01-02: qty 5

## 2018-01-02 MED ORDER — OXYCODONE-ACETAMINOPHEN 5-325 MG PO TABS: 1.0000 | ORAL_TABLET | ORAL | 0 refills | Status: DC | PRN

## 2018-01-02 MED ORDER — MEPERIDINE HCL 25 MG/ML IJ SOLN
6.2500 mg | INTRAMUSCULAR | Status: DC | PRN
  Filled 2018-01-02: qty 1

## 2018-01-02 MED ORDER — MIDAZOLAM HCL 2 MG/2ML IJ SOLN
INTRAMUSCULAR | Status: DC | PRN
  Administered 2018-01-02: 2 mg via INTRAVENOUS

## 2018-01-02 MED ORDER — KETOROLAC TROMETHAMINE 30 MG/ML IJ SOLN
INTRAMUSCULAR | Status: AC
  Filled 2018-01-02: qty 1

## 2018-01-02 MED ORDER — ONDANSETRON HCL 4 MG/2ML IJ SOLN
INTRAMUSCULAR | Status: AC
  Filled 2018-01-02: qty 2

## 2018-01-02 MED ORDER — KETOROLAC TROMETHAMINE 60 MG/2ML IM SOLN
60.0000 mg | Freq: Once | INTRAMUSCULAR | Status: AC
Start: 2018-01-02 — End: 2018-01-02
  Administered 2018-01-02: 60 mg via INTRAMUSCULAR
  Filled 2018-01-02: qty 2

## 2018-01-02 MED ORDER — LACTATED RINGERS IV SOLN
INTRAVENOUS | Status: DC
  Administered 2018-01-02 (×2): via INTRAVENOUS
  Filled 2018-01-02: qty 1000

## 2018-01-02 MED ORDER — PROPOFOL 10 MG/ML IV BOLUS
INTRAVENOUS | Status: AC
  Filled 2018-01-02: qty 20

## 2018-01-02 MED ORDER — ONDANSETRON HCL 4 MG/2ML IJ SOLN
INTRAMUSCULAR | Status: DC | PRN
  Administered 2018-01-02: 4 mg via INTRAVENOUS

## 2018-01-02 MED ORDER — PROPOFOL 10 MG/ML IV BOLUS
INTRAVENOUS | Status: DC | PRN
  Administered 2018-01-02: 200 mg via INTRAVENOUS
  Administered 2018-01-02: 20 mg via INTRAVENOUS

## 2018-01-02 MED ORDER — FENTANYL CITRATE (PF) 100 MCG/2ML IJ SOLN
INTRAMUSCULAR | Status: DC | PRN
  Administered 2018-01-02 (×4): 25 ug via INTRAVENOUS

## 2018-01-02 MED ORDER — SODIUM CHLORIDE 0.9 % IV SOLN
2.0000 g | INTRAVENOUS | Status: AC
  Administered 2018-01-02: 2 g via INTRAVENOUS
  Filled 2018-01-02: qty 2

## 2018-01-02 MED ORDER — LIDOCAINE 2% (20 MG/ML) 5 ML SYRINGE
INTRAMUSCULAR | Status: DC | PRN
  Administered 2018-01-02: 60 mg via INTRAVENOUS

## 2018-01-02 SURGICAL SUPPLY — 25 items
CANISTER SUCT 3000ML PPV (MISCELLANEOUS) ×2 IMPLANT
CATH ROBINSON RED A/P 16FR (CATHETERS) ×1 IMPLANT
CATH SILICONE 16FRX5CC (CATHETERS) ×1 IMPLANT
CLOTH BEACON ORANGE TIMEOUT ST (SAFETY) ×2 IMPLANT
COUNTER NEEDLE 1200 MAGNETIC (NEEDLE) ×2 IMPLANT
DEVICE MYOSURE LITE (MISCELLANEOUS) IMPLANT
DEVICE MYOSURE REACH (MISCELLANEOUS) IMPLANT
DILATOR CANAL MILEX (MISCELLANEOUS) IMPLANT
FILTER ARTHROSCOPY CONVERTOR (FILTER) ×2 IMPLANT
GLOVE BIO SURGEON STRL SZ7.5 (GLOVE) ×4 IMPLANT
GOWN STRL REUS W/TWL LRG LVL3 (GOWN DISPOSABLE) ×2 IMPLANT
IV NS IRRIG 3000ML ARTHROMATIC (IV SOLUTION) ×2 IMPLANT
KIT TURNOVER CYSTO (KITS) ×2 IMPLANT
MYOSURE XL FIBROID REM (MISCELLANEOUS) ×2
NDL SAFETY ECLIPSE 18X1.5 (NEEDLE) IMPLANT
NEEDLE HYPO 18GX1.5 SHARP (NEEDLE)
PACK VAGINAL MINOR WOMEN LF (CUSTOM PROCEDURE TRAY) ×2 IMPLANT
PAD OB MATERNITY 4.3X12.25 (PERSONAL CARE ITEMS) ×2 IMPLANT
SEAL ROD LENS SCOPE MYOSURE (ABLATOR) ×2 IMPLANT
SYRINGE LUER LOK 1CC (MISCELLANEOUS) IMPLANT
SYSTEM TISS REMOVAL MYSR XL RM (MISCELLANEOUS) IMPLANT
TOWEL OR 17X24 6PK STRL BLUE (TOWEL DISPOSABLE) ×4 IMPLANT
TUBING AQUILEX INFLOW (TUBING) ×2 IMPLANT
TUBING AQUILEX OUTFLOW (TUBING) ×2 IMPLANT
WATER STERILE IRR 500ML POUR (IV SOLUTION) IMPLANT

## 2018-01-02 NOTE — MAU Provider Note (Signed)
Chief Complaint:  Abdominal Pain; Back Pain; Emesis; and Diarrhea   First Provider Initiated Contact with Patient 01/02/18 0402      HPI: Ann Fields is a 22 y.o. [id number] who presents to maternity admissions reporting abdominal and back pain which started after she placed her cytotec (preop med).  Also c/o vomiting and "continuous" diarrhea.  She reports vaginal bleeding, but no vaginal itching/burning, urinary symptoms, h/a, dizziness,, or fever/chills.    Abdominal Pain  This is a new problem. The current episode started today. The onset quality is sudden. The problem occurs constantly. The problem has been unchanged. The pain is located in the generalized abdominal region. The quality of the pain is colicky and cramping. The abdominal pain radiates to the back. Associated symptoms include diarrhea and vomiting. Pertinent negatives include no constipation, fever, frequency, headaches or myalgias. Nothing aggravates the pain. The pain is relieved by nothing. She has tried nothing for the symptoms.  Back Pain  This is a new problem. The current episode started today. The problem occurs constantly. The problem is unchanged. The pain is present in the lumbar spine. Associated symptoms include abdominal pain. Pertinent negatives include no fever or headaches.  Emesis   This is a new problem. The current episode started today. The problem occurs 5 to 10 times per day. There has been no fever. Associated symptoms include abdominal pain and diarrhea. Pertinent negatives include no chills, fever, headaches or myalgias.  Diarrhea   This is a new problem. The current episode started today. The problem has been resolved. Associated symptoms include abdominal pain and vomiting. Pertinent negatives include no chills, fever, headaches or myalgias. Nothing aggravates the symptoms. She has tried nothing for the symptoms.   RN note: Pt presents to MAU c/o pain after taking a pre procedure pill for removal of a  fibroid. Pt reports abdominal pain that radiates to her back and legs. Pt also reports vomiting and diarrhea.    Past Medical History: Past Medical History:  Diagnosis Date  . Endometriosis   . History of seizure    12-30-2017  per pt age 38---  x3 seizure's--  unknown cause after work-up done-- no seizure since  . Leiomyoma of uterus   . Menorrhagia   . Migraines   . Mild asthma     Past obstetric history: OB History  Gravida Para Term Preterm AB Living  0 0 0 0 0 0  SAB TAB Ectopic Multiple Live Births  0 0 0 0 0    Past Surgical History: Past Surgical History:  Procedure Laterality Date  . HYSTEROSCOPY W/ POLYPECTOMY  02/2017  . WISDOM TOOTH EXTRACTION  age 35    Family History: Family History  Problem Relation Age of Onset  . Diabetes Paternal Grandmother     Social History: Social History   Tobacco Use  . Smoking status: Never Smoker  . Smokeless tobacco: Never Used  Substance Use Topics  . Alcohol use: Yes    Comment: Occasional  . Drug use: No    Allergies:  Allergies  Allergen Reactions  . Latex Rash    Meds:  Medications Prior to Admission  Medication Sig Dispense Refill Last Dose  . acetaminophen (TYLENOL) 500 MG tablet Take 500 mg by mouth every 6 (six) hours as needed.   01/02/2018 at Unknown time  . misoprostol (CYTOTEC) 200 MCG tablet Insert tab VAGINALLY hs night before surgery. (Patient taking differently: Place 200 mcg vaginally once. Insert tab VAGINALLY hs night before  surgery on 01-02-2018) 1 tablet 0 01/01/2018 at Unknown time  . albuterol (PROVENTIL HFA;VENTOLIN HFA) 108 (90 BASE) MCG/ACT inhaler Inhale into the lungs every 6 (six) hours as needed for wheezing or shortness of breath.   More than a month at Unknown time  . ibuprofen (ADVIL,MOTRIN) 200 MG tablet Take 200 mg by mouth every 6 (six) hours as needed.   Unknown at Unknown time    I have reviewed patient's Past Medical Hx, Surgical Hx, Family Hx, Social Hx, medications and  allergies.  ROS:  Review of Systems  Constitutional: Negative for chills and fever.  Gastrointestinal: Positive for abdominal pain, diarrhea and vomiting. Negative for constipation.  Genitourinary: Negative for frequency.  Musculoskeletal: Positive for back pain. Negative for myalgias.  Neurological: Negative for headaches.   Other systems negative     Physical Exam   Patient Vitals for the past 24 hrs:  BP Temp Temp src Pulse Resp Height Weight  01/02/18 0347 123/83 98.6 F (37 C) Oral 71 20 5\' 5"  (1.651 m) 195 lb (88.5 kg)   Constitutional: Well-developed, well-nourished female in no acute distress, but is lying prone on bed, moaning  Cardiovascular: normal rate and rhythm, no ectopy audible, S1 & S2 heard, no murmur Respiratory: normal effort, no distress. Lungs CTAB with no wheezes or crackles GI: Abd soft, non-tender.  Nondistended.  No rebound, No guarding.  Bowel Sounds audible  MS: Extremities nontender, no edema, normal ROM Neurologic: Alert and oriented x 4.   Grossly nonfocal. GU: Neg CVAT. Skin:  Warm and Dry Psych:  Affect appropriate.  PELVIC EXAM: Deferred   Labs: Results for orders placed or performed during the hospital encounter of 01/02/18 (from the past 24 hour(s))  Urinalysis, Routine w reflex microscopic     Status: Abnormal   Collection Time: 01/02/18  3:51 AM  Result Value Ref Range   Color, Urine YELLOW YELLOW   APPearance HAZY (A) CLEAR   Specific Gravity, Urine 1.034 (H) 1.005 - 1.030   pH 5.0 5.0 - 8.0   Glucose, UA NEGATIVE NEGATIVE mg/dL   Hgb urine dipstick LARGE (A) NEGATIVE   Bilirubin Urine NEGATIVE NEGATIVE   Ketones, ur NEGATIVE NEGATIVE mg/dL   Protein, ur 30 (A) NEGATIVE mg/dL   Nitrite NEGATIVE NEGATIVE   Leukocytes, UA NEGATIVE NEGATIVE   RBC / HPF >50 (H) 0 - 5 RBC/hpf   WBC, UA 0-5 0 - 5 WBC/hpf   Bacteria, UA NONE SEEN NONE SEEN   Squamous Epithelial / LPF 0-5 0 - 5   Mucus PRESENT        Imaging:  US Pelvis  Complete  Result Date: 12/24/2017 Ultrasound transvaginal and transabdominal shows uterus normal size and echotexture.  Endometrial echo 18.3 mm.  Vascular hypoechoic focus noted within the endometrium 25 x 18 mm.  Right and left ovaries visualized with physiologic changes.  Cul-de-sac negative.  Sonohistogram performed, sterile technique, easy catheter introduction, adequate distention with 21 x 15 x 21 mm mass consistent with a submucous myoma.  A smaller echogenic focus noticed in the lower uterine segment 13 x 6 mm, questionable polyp.  Endometrial sample taken.  Patient tolerated well.   [place] Sonohysterogram  Result Date: 12/24/2017 Ultrasound transvaginal and transabdominal shows uterus normal size and echotexture.  Endometrial echo 18.3 mm.  Vascular hypoechoic focus noted within the endometrium 25 x 18 mm.  Right and left ovaries visualized with physiologic changes.  Cul-de-sac negative.  Sonohistogram performed, sterile technique, easy catheter introduction, adequate distention with  21 x 15 x 21 mm mass consistent with a submucous myoma.  A smaller echogenic focus noticed in the lower uterine segment 13 x 6 mm, questionable polyp.  Endometrial sample taken.  Patient tolerated well.    MAU Course/MDM: I have ordered labs as follows: UA, normal Imaging ordered: None  Results reviewed.   Consult Dr Leo Grosser with presentation and exam findings.   Treatments in MAU included Toradol IM which produced some relief. States went from 10 to 8, but appears significantly better.   Pt stable at time of discharge.  Assessment: Cramping and back pain, likely due to Cytotec Nausea, vomiting, diarrhea due to cytotec  Plan: Discharge home Recommend Report to hospital as scheduled for surgery today  Encouraged to return here or to other Urgent Care/ED if she develops worsening of symptoms, increase in pain, fever, or other concerning symptoms.   Hansel Feinstein CNM, MSN Certified  Nurse-Midwife 01/02/2018 4:02 AM

## 2018-01-02 NOTE — Telephone Encounter (Signed)
Okay for note

## 2018-01-02 NOTE — Op Note (Signed)
[name] 13-May-1996 [id number]   Post Operative Note   Date of surgery:  01/02/2018  Pre Op Dx: Menorrhagia, anemia, endometrial polyp, submucous myoma  Post Op Dx: Menorrhagia, anemia, endometrial polyp, submucous myoma  Procedure: Hysteroscopy, D&C, Myosure resection endometrial polyp and submucous myoma  Surgeon:  Belinda Block Fontaine  Anesthesia:  General  EBL: 30 cc  Distended media discrepancy: 1400 cc saline machine recorded.  Large amount of spillage noted due to leakage around the buttocks and onto the floor.  Complications:  None  Specimen: #1 endometrial polyp and submucous myoma #2 endometrial curetting to pathology  Findings: EUA: External BUS vagina normal.  Cervix normal.  Uterus normal size midline mobile.  Adnexa without masses   Hysteroscopy: Adequate noting fundus, anterior/posterior endometrial surfaces, right/left tubal ostia, lower uterine segment and endocervical canal all visualized.  Large endometrial polyp filling the lower uterine segment resected to the level of the surrounding endometrium.  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  At the end of the procedure both tubal ostia were visualized, left undisturbed away from the resection sites.  Procedure:  The patient was taken to the operating room, was placed in the low dorsal lithotomy position, underwent general anesthesia, received a perineal/vaginal preparation per nursing personnel and the bladder was emptied with an in and out Foley catheterization. The timeout was performed by the surgical team. An EUA was performed. The patient was draped in the usual fashion. The cervix was visualized with a speculum, anterior lip grasped with a single-tooth tenaculum and a paracervical block was placed using 10 cc's of 1% lidocaine. The cervix was gently dilated to admit the Myosure XL hysteroscope and hysteroscopy was performed with findings noted  above. Using the Myosure XL resectoscopic wand the polyp was resected in it's entirety to the level the surrounding endometrium.  This allowed for visualization of the submucous myoma which was subsequently resected in it's entirety to the level of the surrounding endometrium.  A gentle sharp curettage was performed. Both specimens were sent separately to pathology.  Repeat hysteroscopy showed an empty cavity with good distention and no evidence of perforation.  Adequate hemostasis was noted at the myomectomy site.  The instruments were removed and adequate hemostasis was visualized at the tenaculum site and external cervical os.  The specimens were identified for pathology.  The sponge, needle and instrument count were verified correct.  The patient was given intraoperative Toradol, was awakened without difficulty and was taken to the recovery room in good condition having tolerated the procedure well.     Anastasio Auerbach MD, 9:17 AM 01/02/2018

## 2018-01-02 NOTE — H&P (Signed)
  The patient was examined.  I reviewed the proposed surgery and consent form with the patient.  The dictated history and physical is current and accurate and all questions were answered. The patient is ready to proceed with surgery and has a realistic understanding and expectation for the outcome.   Anastasio Auerbach MD, 8:13 AM 01/02/2018

## 2018-01-02 NOTE — Transfer of Care (Signed)
Immediate Anesthesia Transfer of Care Note  Patient: Ann Fields  Procedure(s) Performed: Procedure(s) (LRB): DILATATION & CURETTAGE/HYSTEROSCOPY WITH MYOSURE (N/A)  Patient Location: PACU  Anesthesia Type: General  Level of Consciousness: awake, sedated, patient cooperative and responds to stimulation  Airway & Oxygen Therapy: Patient Spontanous Breathing and Patient connected to face mask oxygen  Post-op Assessment: Report given to PACU RN, Post -op Vital signs reviewed and stable and Patient moving all extremities  Post vital signs: Reviewed and stable  Complications: No apparent anesthesia complications

## 2018-01-02 NOTE — Telephone Encounter (Addendum)
Patient had D&C, Hysteroscopy today. Requested a note for return to work in one week. She is a Cabin crew. Ok to provide?  Note to self: call patient when note is ready.

## 2018-01-02 NOTE — Anesthesia Preprocedure Evaluation (Addendum)
Anesthesia Evaluation  Patient identified by MRN, date of birth, ID band Patient awake    Reviewed: Allergy & Precautions, NPO status , Patient's Chart, lab work & pertinent test results  Airway Mallampati: II  TM Distance: >3 FB Neck ROM: Full    Dental no notable dental hx. (+) Teeth Intact, Dental Advisory Given   Pulmonary asthma ,    Pulmonary exam normal breath sounds clear to auscultation       Cardiovascular negative cardio ROS Normal cardiovascular exam Rhythm:Regular Rate:Normal     Neuro/Psych negative neurological ROS  negative psych ROS   GI/Hepatic negative GI ROS, Neg liver ROS,   Endo/Other  negative endocrine ROS  Renal/GU negative Renal ROS  negative genitourinary   Musculoskeletal negative musculoskeletal ROS (+)   Abdominal   Peds negative pediatric ROS (+)  Hematology negative hematology ROS (+)   Anesthesia Other Findings   Reproductive/Obstetrics negative OB ROS                            Anesthesia Physical Anesthesia Plan  ASA: II  Anesthesia Plan: General   Post-op Pain Management:    Induction: Intravenous  PONV Risk Score and Plan: Ondansetron, Dexamethasone, Midazolam and Treatment may vary due to age or medical condition  Airway Management Planned: LMA  Additional Equipment:   Intra-op Plan:   Post-operative Plan: Extubation in OR  Informed Consent: I have reviewed the patients History and Physical, chart, labs and discussed the procedure including the risks, benefits and alternatives for the proposed anesthesia with the patient or authorized representative who has indicated his/her understanding and acceptance.   Dental advisory given  Plan Discussed with: CRNA  Anesthesia Plan Comments:         Anesthesia Quick Evaluation

## 2018-01-02 NOTE — Discharge Instructions (Signed)
Dilation and Curettage or Vacuum Curettage, Care After These instructions give you information about caring for yourself after your procedure. Your doctor may also give you more specific instructions. Call your doctor if you have any problems or questions after your procedure. Follow these instructions at home: Activity  Do not drive or use heavy machinery while taking prescription pain medicine.  For 24 hours after your procedure, avoid driving.  Take short walks often, followed by rest periods. Ask your doctor what activities are safe for you. After one or two days, you may be able to return to your normal activities.  Do not lift anything that is heavier than 10 lb (4.5 kg) until your doctor approves.  For at least 2 weeks, or as long as told by your doctor: ? Do not douche. ? Do not use tampons. ? Do not have sex. General instructions  Take over-the-counter and prescription medicines only as told by your doctor. This is very important if you take blood thinning medicine.  Do not take baths, swim, or use a hot tub until your doctor approves. Take showers instead of baths.  Wear compression stockings as told by your doctor.  It is up to you to get the results of your procedure. Ask your doctor when your results will be ready.  Keep all follow-up visits as told by your doctor. This is important. Contact a doctor if:  You have very bad cramps that get worse or do not get better with medicine.  You have very bad pain in your belly (abdomen).  You cannot drink fluids without throwing up (vomiting).  You get pain in a different part of the area between your belly and thighs (pelvis).  You have bad-smelling discharge from your vagina.  You have a rash. Get help right away if:  You are bleeding a lot from your vagina. A lot of bleeding means soaking more than one sanitary pad in an hour, for 2 hours in a row.  You have clumps of blood (blood clots) coming from your  vagina.  You have a fever or chills.  Your belly feels very tender or hard.  You have chest pain.  You have trouble breathing.  You cough up blood.  You feel dizzy.  You feel light-headed.  You pass out (faint).  You have pain in your neck or shoulder area. Summary  Take short walks often, followed by rest periods. Ask your doctor what activities are safe for you. After one or two days, you may be able to return to your normal activities.  Do not lift anything that is heavier than 10 lb (4.5 kg) until your doctor approves.  Do not take baths, swim, or use a hot tub until your doctor approves. Take showers instead of baths.  Contact your doctor if you have any symptoms of infection, like bad-smelling discharge from your vagina. This information is not intended to replace advice given to you by your health care provider. Make sure you discuss any questions you have with your health care provider. Document Released: 05/14/2008 Document Revised: 04/22/2016 Document Reviewed: 04/22/2016 Elsevier Interactive Patient Education  2017 [place]. Misoprostol tablets What is this medicine? MISOPROSTOL (mye soe PROST ole) helps to prevent stomach ulcers in patients who take medicines like ibuprofen and aspirin and who are at high risk of complications from ulcers. This medicine may be used for other purposes; ask your health care provider or pharmacist if you have questions. COMMON BRAND NAME(S): Cytotec What should I  tell my health care provider before I take this medicine? They need to know if you have any of these conditions: -Crohn's disease -heart disease -kidney disease -ulcerative colitis -an unusual or allergic reaction to misoprostol, prostaglandins, other medicines, foods, dyes, or preservatives -pregnant or trying to get pregnant -breast-feeding How should I use this medicine? Take this medicine by mouth with a full glass of water. Follow the directions on the  prescription label. Take this medicine with food.Take your medicine at regular intervals. Do not take your medicine more often than directed. Talk to your pediatrician regarding the use of this medicine in children. Special care may be needed. Overdosage: If you think you have taken too much of this medicine contact a poison control center or emergency room at once. NOTE: This medicine is only for you. Do not share this medicine with others. What if I miss a dose? If you miss a dose, take it as soon as you can. If it is almost time for your next dose, take only that dose. Do not take double or extra doses. What may interact with this medicine? -antacids This list may not describe all possible interactions. Give your health care provider a list of all the medicines, herbs, non-prescription drugs, or dietary supplements you use. Also tell them if you smoke, drink alcohol, or use illegal drugs. Some items may interact with your medicine. What should I watch for while using this medicine? Do not smoke cigarettes or drink alcohol. These increase irritation to your stomach and can make it more susceptible to damage from medicine like ibuprofen and aspirin. If you are female, do not use this medicine if you are pregnant. Do not get pregnant while taking this medicine and for at least one month (one full menstrual cycle) after stopping this medicine. If you can become pregnant, use a reliable form of birth control while taking this medicine. Talk to your doctor about birth control options. If you do become pregnant, think you are pregnant, or want to become pregnant, immediately call your doctor for advice. What side effects may I notice from receiving this medicine? Side effects that you should report to your doctor or health care professional as soon as possible: -allergic reactions like skin rash, itching or hives, swelling of the face, lips, or tongue -chest pain -fainting spells -severe  diarrhea -sudden shortness of breath -unusual vaginal bleeding, pelvic pain, or cramping Side effects that usually do not require medical attention (report to your doctor or health care professional if they continue or are bothersome): -dizziness -headache -menstrual irregularity, spotting, or cramps -mild diarrhea -nausea -stomach upset or cramps This list may not describe all possible side effects. Call your doctor for medical advice about side effects. You may report side effects to FDA at 1-800-FDA-1088. Where should I keep my medicine? Keep out of the reach of children. Store at room temperature below 25 degrees C (77 degrees F). Keep in a dry place. Protect from moisture. Throw away any unused medicine after the expiration date. NOTE: This sheet is a summary. It may not cover all possible information. If you have questions about this medicine, talk to your doctor, pharmacist, or health care provider.  2018 Elsevier/Gold Standard (2008-07-19 10:59:53)

## 2018-01-02 NOTE — Anesthesia Postprocedure Evaluation (Signed)
Anesthesia Post Note  Patient: Ann Fields  Procedure(s) Performed: DILATATION & CURETTAGE/HYSTEROSCOPY WITH MYOSURE (N/A )     Patient location during evaluation: PACU Anesthesia Type: General Level of consciousness: awake and alert Pain management: pain level controlled Vital Signs Assessment: post-procedure vital signs reviewed and stable Respiratory status: spontaneous breathing, nonlabored ventilation, respiratory function stable and patient connected to nasal cannula oxygen Cardiovascular status: blood pressure returned to baseline and stable Postop Assessment: no apparent nausea or vomiting Anesthetic complications: no    Last Vitals:  Vitals:   01/02/18 1000 01/02/18 1105  BP: 122/82 121/82  Pulse: 60 65  Resp: 15 14  Temp:  36.8 C  SpO2: 100% 100%    Last Pain:  Vitals:   01/02/18 1105  TempSrc:   PainSc: 2                  Montez Hageman

## 2018-01-02 NOTE — Discharge Instructions (Signed)

## 2018-01-02 NOTE — Anesthesia Procedure Notes (Signed)
Procedure Name: LMA Insertion Date/Time: 01/02/2018 8:26 AM Performed by: Wanita Chamberlain, CRNA Pre-anesthesia Checklist: Patient identified, Emergency Drugs available, Suction available, Patient being monitored and Timeout performed Patient Re-evaluated:Patient Re-evaluated prior to induction Oxygen Delivery Method: Circle system utilized Preoxygenation: Pre-oxygenation with 100% oxygen Induction Type: IV induction Ventilation: Mask ventilation without difficulty LMA: LMA inserted LMA Size: 4.0 Number of attempts: 1 Placement Confirmation: breath sounds checked- equal and bilateral,  CO2 detector and positive ETCO2 Tube secured with: Tape Dental Injury: Teeth and Oropharynx as per pre-operative assessment

## 2018-01-02 NOTE — MAU Note (Signed)
Pt presents to MAU c/o pain after taking a pre procedure pill for removal of a fibroid. Pt reports abdominal pain that radiates to her back and legs. Pt also reports vomiting and diarrhea.

## 2018-01-05 ENCOUNTER — Encounter (HOSPITAL_BASED_OUTPATIENT_CLINIC_OR_DEPARTMENT_OTHER): Payer: Self-pay | Admitting: Gynecology

## 2018-01-05 ENCOUNTER — Telehealth: Payer: Self-pay | Admitting: Obstetrics and Gynecology

## 2018-01-05 NOTE — Addendum Note (Signed)
Addendum  created 01/05/18 0703 by Wanita Chamberlain, CRNA   Charge Capture section accepted

## 2018-01-05 NOTE — Telephone Encounter (Signed)
Note provided to patient.

## 2018-01-05 NOTE — Telephone Encounter (Signed)
TC returned to pt of Dr.Fontaine who called via the after hours answering service.  Pt scheduled for hysteroscopy 01/02/18.  Was prescribed misoprostol for cervical prep.  After placing the tablet vaginally, she began having nausea, vomiting, diarrhea and severe abdominal cramping.  The vomiting has subsided, but the cramping continues and was not improved with Tylenol.  Pt states she was told by Dr. Gregor Hams assistant not to take ibuprofen. Pt states she was unable to sleep.  I offered the option of being seen at [facility] MAU for parenteral analgesic until her surgery this am.  She plans to be seen at MAU

## 2018-01-15 ENCOUNTER — Encounter: Payer: Self-pay | Admitting: Gynecology

## 2018-01-15 ENCOUNTER — Ambulatory Visit (INDEPENDENT_AMBULATORY_CARE_PROVIDER_SITE_OTHER): Payer: BLUE CROSS/BLUE SHIELD | Admitting: Gynecology

## 2018-01-15 VITALS — BP 118/76

## 2018-01-15 DIAGNOSIS — Z09 Encounter for follow-up examination after completed treatment for conditions other than malignant neoplasm: Secondary | ICD-10-CM

## 2018-01-15 NOTE — Patient Instructions (Signed)
Follow-up the end of this year for annual exam.

## 2018-01-15 NOTE — Progress Notes (Signed)
    SELENI MELLER 05-30-1996 [id number]        22 y.o.  G0P0000 presents for postoperative checkup status post hysteroscopic resection endometrial polyp and submucous myoma.  Has done well since.  Currently on her menses and notes it is lighter than previously.  Past medical history,surgical history, problem list, medications, allergies, family history and social history were all reviewed and documented in the EPIC chart.  Directed ROS with pertinent positives and negatives documented in the history of present illness/assessment and plan.  Exam: Caryn Bee assistant Vitals:   01/15/18 0926  BP: 118/76   General appearance:  Normal Abdomen soft nontender without masses guarding rebound Pelvic external BUS vagina with light menses flow.  Cervix normal.  Uterus normal size midline mobile nontender.  Adnexa without masses or tenderness.  Assessment/Plan:  22 y.o. G0P0000 with normal postoperative visit status post hysteroscopic resection submucous myoma and endometrial polyp.  Reviewed benign pathology with her as well as pictures.  Patient will keep menstrual calendar and as long as doing well she will follow-up the end of this year for annual exam, sooner if any issues.    Anastasio Auerbach MD, 9:44 AM 01/15/2018

## 2018-04-29 ENCOUNTER — Emergency Department (HOSPITAL_COMMUNITY)
Admission: EM | Admit: 2018-04-29 | Discharge: 2018-04-30 | Disposition: A | Discharge status: Home / Self Care | Payer: BLUE CROSS/BLUE SHIELD | Attending: Emergency Medicine | Admitting: Emergency Medicine

## 2018-04-29 ENCOUNTER — Other Ambulatory Visit: Payer: Self-pay

## 2018-04-29 ENCOUNTER — Encounter (HOSPITAL_COMMUNITY): Payer: Self-pay

## 2018-04-29 DIAGNOSIS — Z5321 Procedure and treatment not carried out due to patient leaving prior to being seen by health care provider: Secondary | ICD-10-CM | POA: Insufficient documentation

## 2018-04-29 DIAGNOSIS — H9201 Otalgia, right ear: Secondary | ICD-10-CM | POA: Diagnosis not present

## 2018-04-29 NOTE — ED Triage Notes (Signed)
Pt here for right sided ear pain.  Reported white drainage from the ear.  No injury or trauma reported.

## 2018-04-30 ENCOUNTER — Encounter (HOSPITAL_BASED_OUTPATIENT_CLINIC_OR_DEPARTMENT_OTHER): Payer: Self-pay | Admitting: Emergency Medicine

## 2018-04-30 ENCOUNTER — Emergency Department (HOSPITAL_BASED_OUTPATIENT_CLINIC_OR_DEPARTMENT_OTHER)
Admission: EM | Admit: 2018-04-30 | Discharge: 2018-04-30 | Disposition: A | Discharge status: Home / Self Care | Payer: BLUE CROSS/BLUE SHIELD | Source: Home / Self Care | Attending: Emergency Medicine | Admitting: Emergency Medicine

## 2018-04-30 DIAGNOSIS — H9201 Otalgia, right ear: Secondary | ICD-10-CM

## 2018-04-30 DIAGNOSIS — H6091 Unspecified otitis externa, right ear: Secondary | ICD-10-CM | POA: Diagnosis not present

## 2018-04-30 DIAGNOSIS — J45909 Unspecified asthma, uncomplicated: Secondary | ICD-10-CM

## 2018-04-30 DIAGNOSIS — Z91040 Latex allergy status: Secondary | ICD-10-CM

## 2018-04-30 NOTE — ED Provider Notes (Signed)
[facility] EMERGENCY DEPARTMENT Provider Note   CSN: [id number] Arrival date & time: 04/30/18  0138     History   Chief Complaint No chief complaint on file.   HPI Ann Fields is a 22 y.o. female.  The history is provided by the patient.  Otalgia  This is a new problem. The current episode started yesterday. There is pain in the right ear. The problem occurs constantly. The problem has not changed since onset.There has been no fever. The pain is at a severity of 10/10. The pain is severe. Pertinent negatives include no ear discharge, no headaches, no hearing loss, no rhinorrhea, no sore throat, no abdominal pain, no diarrhea and no neck pain. Her past medical history does not include chronic ear infection.    Past Medical History:  Diagnosis Date  . Endometriosis   . History of seizure    12-30-2017  per pt age 58---  x3 seizure's--  unknown cause after work-up done-- no seizure since  . Leiomyoma of uterus   . Menorrhagia   . Migraines   . Mild asthma     There are no active problems to display for this patient.   Past Surgical History:  Procedure Laterality Date  . DILATATION & CURETTAGE/HYSTEROSCOPY WITH MYOSURE N/A 01/02/2018   Procedure: DILATATION & CURETTAGE/HYSTEROSCOPY WITH MYOSURE;  Surgeon: Anastasio Auerbach, MD;  Location: [facility];  Service: Gynecology;  Laterality: N/A;  request to follow in [place] gyn block time requests one hour  . HYSTEROSCOPY W/ POLYPECTOMY  02/2017  . WISDOM TOOTH EXTRACTION  age 56     OB History    Gravida  0   Para  0   Term  0   Preterm  0   AB  0   Living  0     SAB  0   TAB  0   Ectopic  0   Multiple  0   Live Births  0            Home Medications    Prior to Admission medications   Medication Sig Start Date End Date Taking? Authorizing Provider  acetaminophen (TYLENOL) 500 MG tablet Take 500 mg by mouth every 6 (six) hours as needed.    [provider]   albuterol (PROVENTIL HFA;VENTOLIN HFA) 108 (90 BASE) MCG/ACT inhaler Inhale into the lungs every 6 (six) hours as needed for wheezing or shortness of breath.    [provider]  ibuprofen (ADVIL,MOTRIN) 200 MG tablet Take 200 mg by mouth every 6 (six) hours as needed.    [provider]    Family History Family History  Problem Relation Age of Onset  . Diabetes Paternal Grandmother     Social History Social History   Tobacco Use  . Smoking status: Never Smoker  . Smokeless tobacco: Never Used  Substance Use Topics  . Alcohol use: Yes    Comment: Occasional  . Drug use: No     Allergies   Latex   Review of Systems Review of Systems  Constitutional: Negative for fatigue and fever.  HENT: Positive for ear pain. Negative for dental problem, ear discharge, facial swelling, hearing loss, rhinorrhea, sore throat, trouble swallowing and voice change.   Gastrointestinal: Negative for abdominal pain and diarrhea.  Musculoskeletal: Negative for neck pain.  Neurological: Negative for headaches.  All other systems reviewed and are negative.    Physical Exam Updated Vital Signs LMP  (LMP Unknown)  Physical Exam  Constitutional: She is oriented to person, place, and time. She appears well-developed and well-nourished. No distress.  HENT:  Head: Normocephalic and atraumatic.  Right Ear: No mastoid tenderness. Tympanic membrane is not injected, not scarred, not perforated, not erythematous, not retracted and not bulging. No hemotympanum.  Left Ear: No mastoid tenderness. Tympanic membrane is not injected, not scarred, not perforated, not erythematous, not retracted and not bulging. No hemotympanum.  Nose: Nose normal.  Mouth/Throat: No oropharyngeal exudate.  Eyes: Pupils are equal, round, and reactive to light. Conjunctivae are normal.  Neck: Normal range of motion. Neck supple.  Cardiovascular: Normal rate, regular rhythm, normal heart sounds and intact  distal pulses.  Pulmonary/Chest: Effort normal and breath sounds normal. No stridor. She has no wheezes. She has no rales.  Abdominal: Soft. Bowel sounds are normal. She exhibits no mass. There is no tenderness. There is no rebound and no guarding.  Musculoskeletal: Normal range of motion.  Neurological: She is alert and oriented to person, place, and time.  Skin: Skin is warm and dry. Capillary refill takes less than 2 seconds.  Psychiatric: She has a normal mood and affect.  Nursing note and vitals reviewed.    ED Treatments / Results  Labs (all labs ordered are listed, but only abnormal results are displayed) Labs Reviewed - No data to display  EKG None  Radiology No results found.  Procedures Procedures (including critical care time)      Final Clinical Impressions(s) / ED Diagnoses   Final diagnoses:  Right ear pain   Patient left [facility] because wait was too long.  Then nurse Clarise Cruz reports frined wanted to use otoscope and was told no.  EDP explained patient was getting an RX for ear drops but then patient reportedly because angry with nurse than she was getting an RX for drops despite having been told this by EDP and was angry that the wait was too short.    Return for fevers >100.4 unrelieved by medication, shortness of breath, intractable vomiting, or diarrhea, Inability to tolerate liquids or food, cough, altered mental status or any concerns. No signs of systemic illness or infection. The patient is nontoxic-appearing on exam and vital signs are within normal limits.   I have reviewed the triage vital signs and the nursing notes. Pertinent labs &imaging results that were available during my care of the patient were reviewed by me and considered in my medical decision making (see chart for details).  After history, exam, and medical workup I feel the patient has been appropriately medically screened and is safe for discharge home. Pertinent diagnoses were discussed  with the patient. Patient was given return precautions.   Yusuke Beza, MD 04/30/18 [phone]

## 2018-04-30 NOTE — ED Notes (Signed)
Pt seen during computer down time. See paper chart.

## 2018-07-30 ENCOUNTER — Encounter: Payer: BLUE CROSS/BLUE SHIELD | Admitting: Gynecology

## 2018-08-10 ENCOUNTER — Ambulatory Visit (INDEPENDENT_AMBULATORY_CARE_PROVIDER_SITE_OTHER): Payer: BLUE CROSS/BLUE SHIELD | Admitting: Gynecology

## 2018-08-10 ENCOUNTER — Encounter: Payer: Self-pay | Admitting: Gynecology

## 2018-08-10 VITALS — BP 120/82 | Ht 65.0 in | Wt 218.0 lb

## 2018-08-10 DIAGNOSIS — Z13220 Encounter for screening for lipoid disorders: Secondary | ICD-10-CM

## 2018-08-10 DIAGNOSIS — Z01419 Encounter for gynecological examination (general) (routine) without abnormal findings: Secondary | ICD-10-CM

## 2018-08-10 DIAGNOSIS — Z113 Encounter for screening for infections with a predominantly sexual mode of transmission: Secondary | ICD-10-CM

## 2018-08-10 DIAGNOSIS — N912 Amenorrhea, unspecified: Secondary | ICD-10-CM

## 2018-08-10 LAB — PREGNANCY, URINE: PREG TEST UR: NEGATIVE

## 2018-08-10 NOTE — Patient Instructions (Signed)
Call if without menses in another week or 2.  Follow-up in 1 year for annual exam, sooner if any issues.

## 2018-08-10 NOTE — Addendum Note (Signed)
Addended by: Lorine Bears on: 08/10/2018 10:16 AM   Modules accepted: Orders

## 2018-08-10 NOTE — Progress Notes (Signed)
    Ann Fields Mar 18, 1996 [id number]        22 y.o.  G0P0000 for annual gynecologic exam.  Late for menses with LMP 07/05/2018.  Regular menses before.  Status post hysteroscopic resection endometrial polyp earlier this year with regular light menses.  Not using contraception consistently.  Past medical history,surgical history, problem list, medications, allergies, family history and social history were all reviewed and documented as reviewed in the EPIC chart.  ROS:  Performed with pertinent positives and negatives included in the history, assessment and plan.   Additional significant findings : None   Exam: Ann Fields assistant Vitals:   08/10/18 0927  BP: 120/82  Weight: 218 lb (98.9 kg)  Height: 5\' 5"  (3.646 m)   Body mass index is 36.28 kg/m.  General appearance:  Normal affect, orientation and appearance. Skin: Grossly normal HEENT: Without gross lesions.  No cervical or supraclavicular adenopathy. Thyroid normal.  Lungs:  Clear without wheezing, rales or rhonchi Cardiac: RR, without RMG Abdominal:  Soft, nontender, without masses, guarding, rebound, organomegaly or hernia Breasts:  Examined lying and sitting without masses, retractions, discharge or axillary adenopathy. Pelvic:  Ext, BUS, Vagina: Normal  Cervix: Normal.  Pap smear done, GC/chlamydia  Uterus: Anteverted, normal size, shape and contour, midline and mobile nontender   Adnexa: Without masses or tenderness    Anus and perineum: Normal    Assessment/Plan:  22 y.o. G0P0000 female for annual gynecologic exam.   1. Late for menses.  UPT negative here.  Will monitor for now.  Will call if without menses in another 1 to 2 weeks and will plan on progesterone withdrawal then after rechecking pregnancy test.  Discussed contraceptive options and patient declines stating she has tried multiple different types in the past and had issues and prefer not to use contraception understanding and accepting pregnancy  risks. 2. STD screening requested.  GC/Chlamydia done today.  She declined serum screening.  No known exposure but just wanted to be screened for GC/chlamydia. 3. Breast health.  Breast exam normal today.  SBE monthly reviewed. 4. Gardasil series reportedly received. 5. Health maintenance.  Baseline CBC, comprehensive metabolic panel lipid profile ordered.  Follow-up in 1 year, sooner as needed.   Ann Auerbach MD, 9:58 AM 08/10/2018

## 2018-08-11 LAB — COMPREHENSIVE METABOLIC PANEL
AG Ratio: 1.4 (calc) (ref 1.0–2.5)
ALKALINE PHOSPHATASE (APISO): 66 U/L (ref 33–115)
ALT: 12 U/L (ref 6–29)
AST: 16 U/L (ref 10–30)
Albumin: 4 g/dL (ref 3.6–5.1)
BILIRUBIN TOTAL: 0.5 mg/dL (ref 0.2–1.2)
BUN: 9 mg/dL (ref 7–25)
CALCIUM: 9.1 mg/dL (ref 8.6–10.2)
CO2: 23 mmol/L (ref 20–32)
Chloride: 104 mmol/L (ref 98–110)
Creat: 0.7 mg/dL (ref 0.50–1.10)
Globulin: 2.8 g/dL (calc) (ref 1.9–3.7)
Glucose, Bld: 93 mg/dL (ref 65–99)
POTASSIUM: 4.5 mmol/L (ref 3.5–5.3)
SODIUM: 138 mmol/L (ref 135–146)
Total Protein: 6.8 g/dL (ref 6.1–8.1)

## 2018-08-11 LAB — CBC WITH DIFFERENTIAL/PLATELET
Absolute Monocytes: 510 cells/uL (ref 200–950)
BASOS ABS: 41 {cells}/uL (ref 0–200)
Basophils Relative: 0.5 %
EOS PCT: 1.5 %
Eosinophils Absolute: 122 cells/uL (ref 15–500)
HCT: 37.9 % (ref 35.0–45.0)
Hemoglobin: 12.1 g/dL (ref 11.7–15.5)
Lymphs Abs: 2543 cells/uL (ref 850–3900)
MCH: 24.2 pg — ABNORMAL LOW (ref 27.0–33.0)
MCHC: 31.9 g/dL — AB (ref 32.0–36.0)
MCV: 75.8 fL — ABNORMAL LOW (ref 80.0–100.0)
MONOS PCT: 6.3 %
MPV: 12.3 fL (ref 7.5–12.5)
Neutro Abs: 4884 cells/uL (ref 1500–7800)
Neutrophils Relative %: 60.3 %
PLATELETS: 312 10*3/uL (ref 140–400)
RBC: 5 10*6/uL (ref 3.80–5.10)
RDW: 15.8 % — AB (ref 11.0–15.0)
Total Lymphocyte: 31.4 %
WBC: 8.1 10*3/uL (ref 3.8–10.8)

## 2018-08-11 LAB — LIPID PANEL
Cholesterol: 189 mg/dL (ref <200)
HDL: 49 mg/dL — ABNORMAL LOW (ref >50)
LDL CHOLESTEROL (CALC): 112 mg/dL — AB
Non-HDL Cholesterol (Calc): 140 mg/dL (calc) — ABNORMAL HIGH (ref <130)
TRIGLYCERIDES: 165 mg/dL — AB (ref <150)
Total CHOL/HDL Ratio: 3.9 (calc) (ref <5.0)

## 2018-08-11 LAB — C. TRACHOMATIS/N. GONORRHOEAE RNA
C. TRACHOMATIS RNA, TMA: NOT DETECTED
N. GONORRHOEAE RNA, TMA: NOT DETECTED

## 2018-08-13 ENCOUNTER — Telehealth: Payer: Self-pay | Admitting: *Deleted

## 2018-08-13 LAB — PAP IG W/ RFLX HPV ASCU

## 2018-08-13 NOTE — Telephone Encounter (Signed)
Left message for patient to call.

## 2018-08-13 NOTE — Telephone Encounter (Signed)
-----   Message from Anastasio Auerbach, MD sent at 08/10/2018 10:05 AM EST ----- I just wanted to check with the patient to make sure she received the Gardasil series.  If not then I recommend that she start and complete the Gardasil series.

## 2018-08-20 ENCOUNTER — Other Ambulatory Visit: Payer: Self-pay | Admitting: *Deleted

## 2018-08-20 MED ORDER — FLUCONAZOLE 150 MG PO TABS: 150.0000 mg | ORAL_TABLET | Freq: Once | ORAL | 0 refills | Status: AC

## 2018-08-20 NOTE — Telephone Encounter (Signed)
Patient did not have series that she could remember, she is going to read about it and call back to schedule nurse appointment.

## 2018-11-10 IMAGING — US US TRANSVAGINAL NON-OB
1 series · 13 of 25 positions shown · non-contrast
Comparison: None

CLINICAL DATA: Chronic abnormal uterine bleeding. Initial
encounter.

EXAM:
TRANSABDOMINAL AND TRANSVAGINAL ULTRASOUND OF PELVIS
TECHNIQUE: Both transabdominal and transvaginal ultrasound examinations of the
pelvis were performed. Transabdominal technique was performed for
global imaging of the pelvis including uterus, ovaries, adnexal
regions, and pelvic cul-de-sac. It was necessary to proceed with
endovaginal exam following the transabdominal exam to visualize the
uterus and ovaries in greater detail.

[Series 1: us transvaginal non-ob · 0.20mm/px · 13 of 56 slices shown]
[im 1/56]
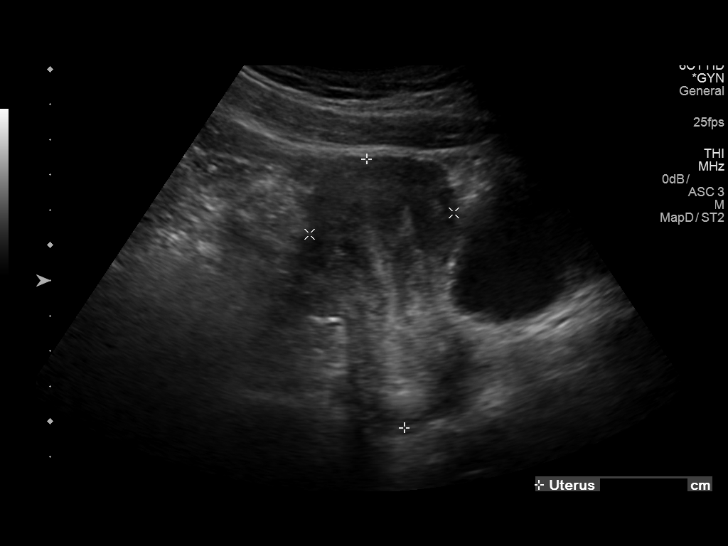
[im 5/56]
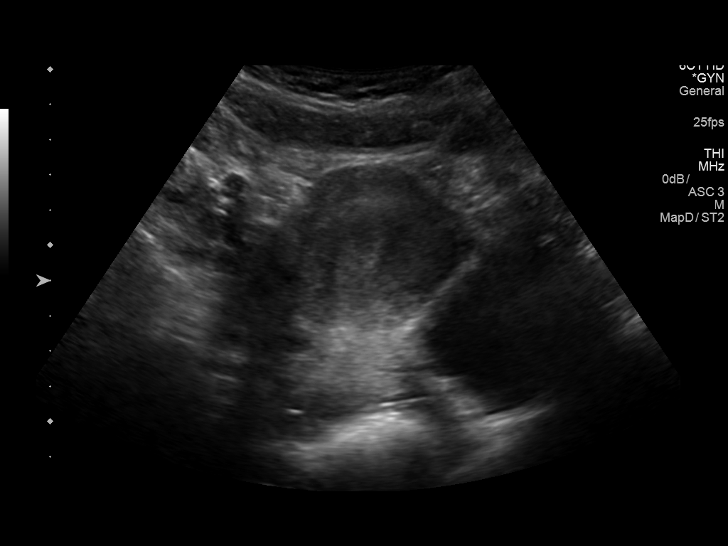
[im 10/56]
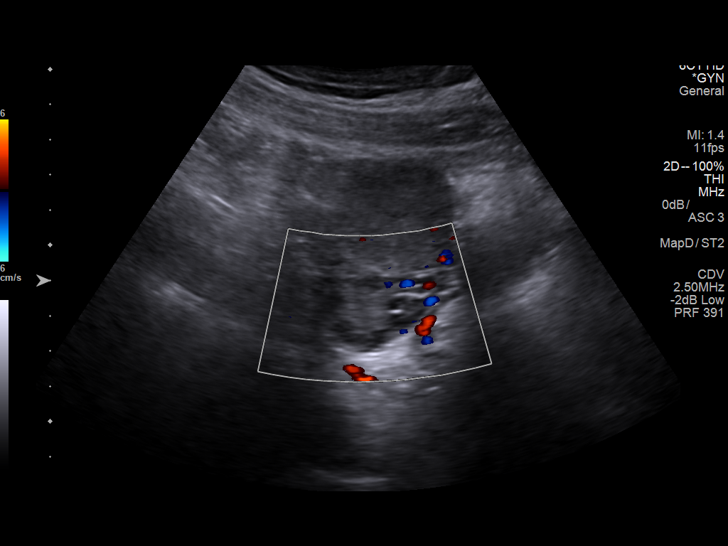
[im 14/56]
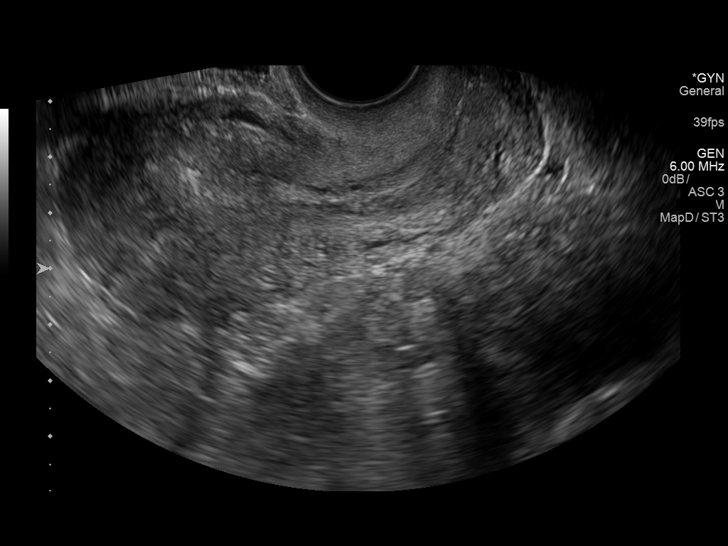
[im 19/56]
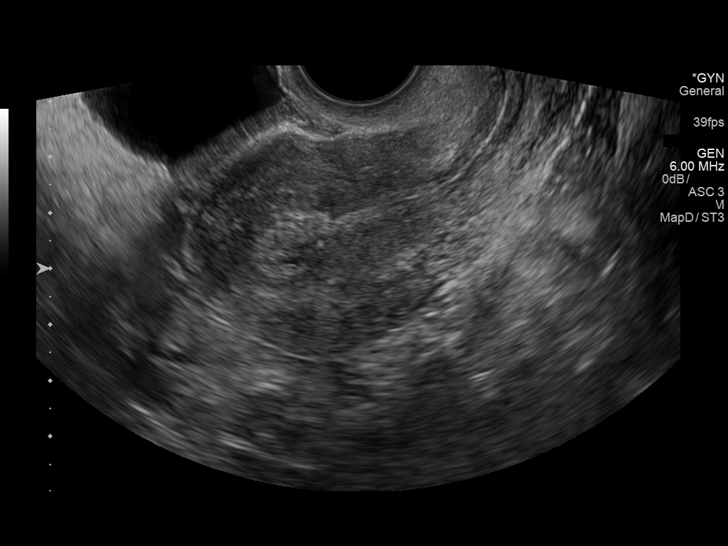
[im 23/56]
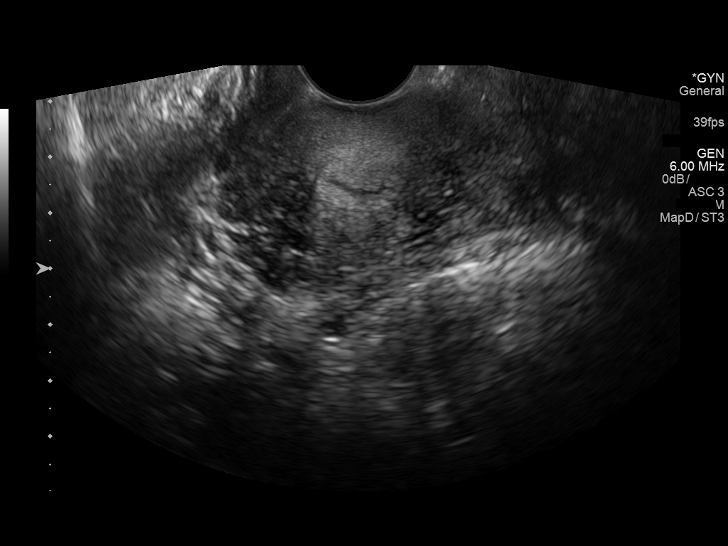
[im 28/56]
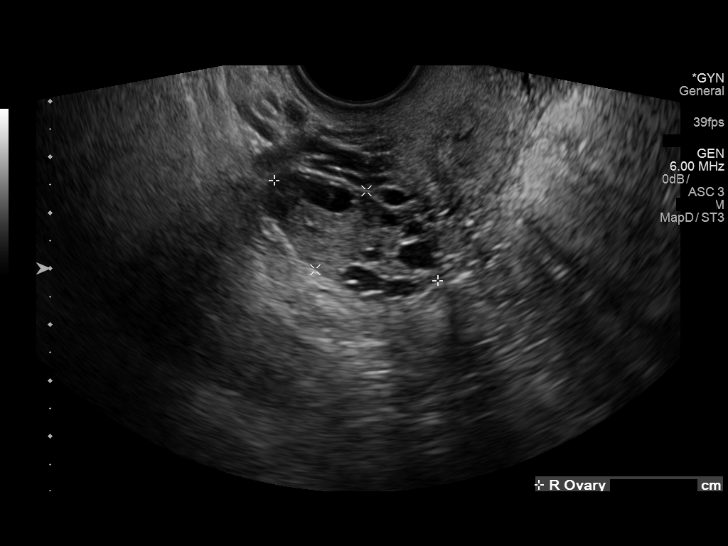
[im 33/56]
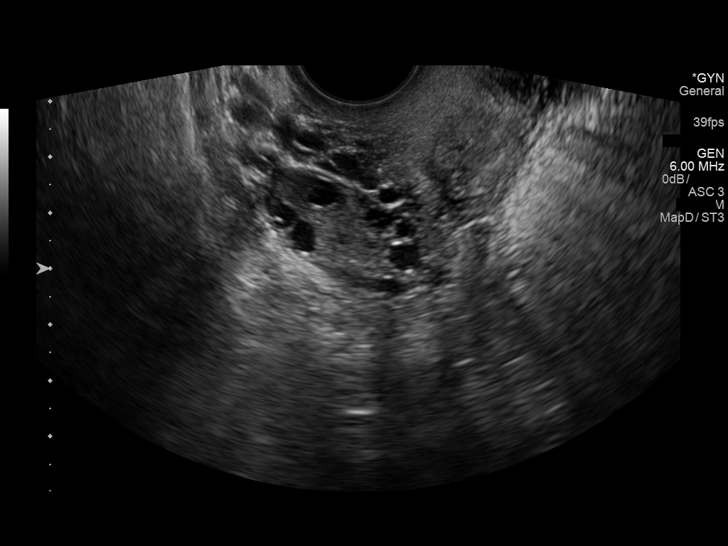
[im 37/56]
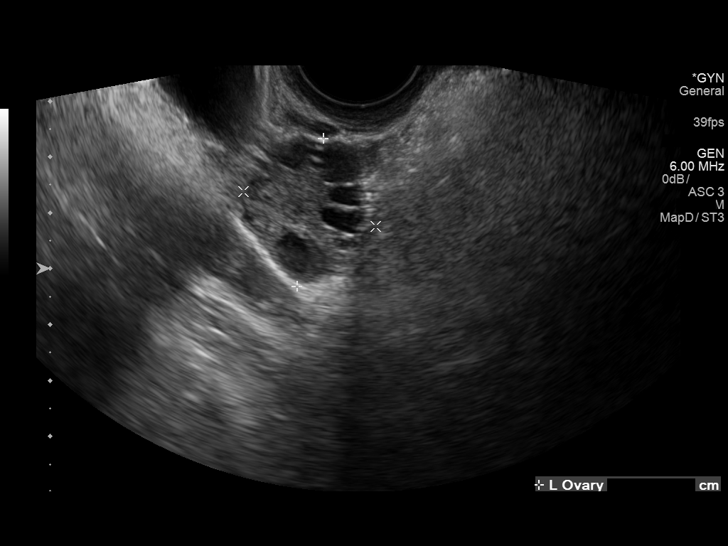
[im 42/56]
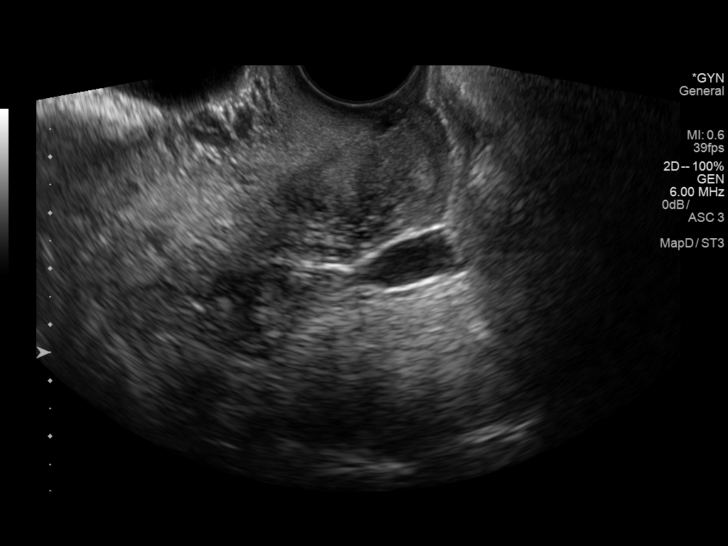
[im 46/56]
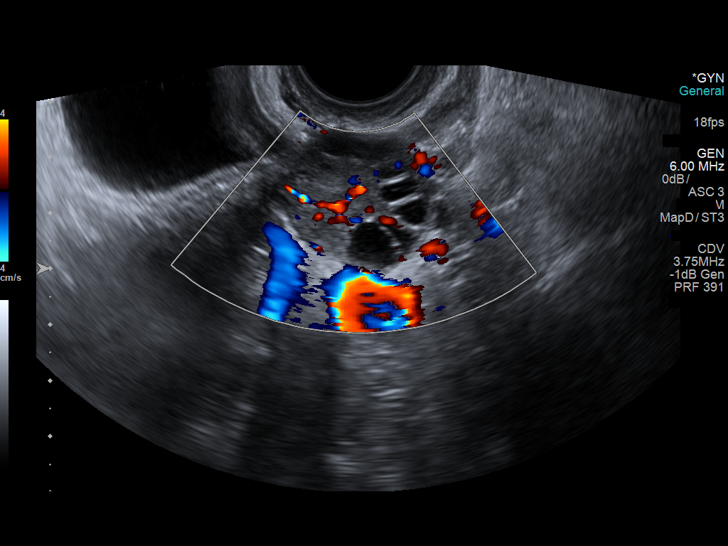
[im 51/56]
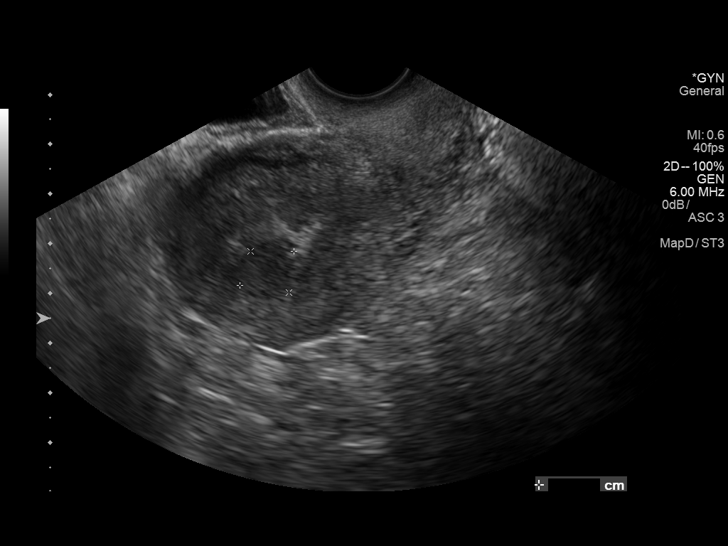
[im 56/56]
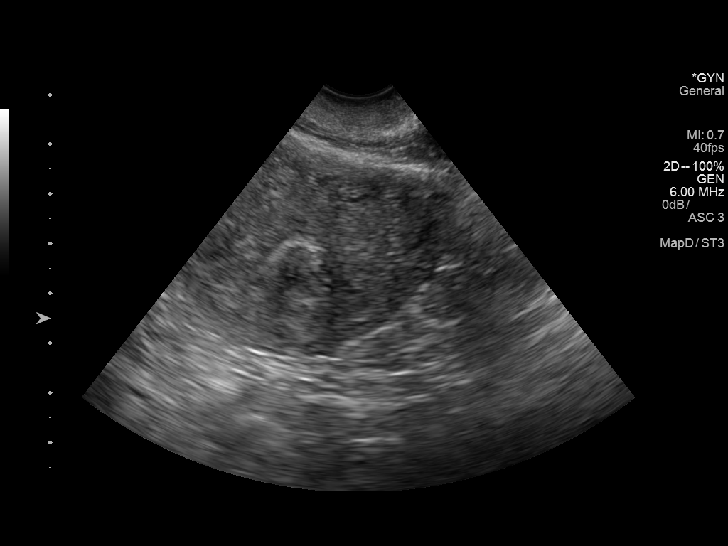

[13 of 25 positions shown; findings below may reference images not displayed]

FINDINGS: Uterus

Measurements: 8.7 x 3.9 x 5.8 cm. No fibroids visualized.

Endometrium

Thickness: 1.2 cm. An apparent small solid hypoechoic mass is noted
within the endometrial echo complex, with an associated feeding
vessel on limited Doppler evaluation. This measures approximately
1.3 x 1.1 x 1.1 cm.

Right ovary

Measurements: 3.4 x 1.7 x 2.1 cm. Normal appearance/no adnexal mass.

Left ovary

Measurements: 2.7 x 2.5 x 2.3 cm. Normal appearance/no adnexal mass.

Other findings

Trace free fluid is seen within the pelvic cul-de-sac.
IMPRESSION: Apparent small 1.3 cm solid hypoechoic mass within the endometrial
echo complex, with associated feeding vessel on limited Doppler
evaluation. This has lower echogenicity than expected for an
endometrial polyp, and malignancy cannot be excluded. OB/GYN
consultation is recommended for further evaluation.

## 2019-01-05 ENCOUNTER — Other Ambulatory Visit: Payer: Self-pay

## 2019-01-07 ENCOUNTER — Encounter: Payer: Self-pay | Admitting: Gynecology

## 2019-01-07 ENCOUNTER — Ambulatory Visit (INDEPENDENT_AMBULATORY_CARE_PROVIDER_SITE_OTHER): Payer: Self-pay | Admitting: Gynecology

## 2019-01-07 ENCOUNTER — Other Ambulatory Visit: Payer: Self-pay

## 2019-01-07 VITALS — BP 118/78

## 2019-01-07 DIAGNOSIS — N912 Amenorrhea, unspecified: Secondary | ICD-10-CM

## 2019-01-07 MED ORDER — MEDROXYPROGESTERONE ACETATE 10 MG PO TABS: 10.0000 mg | ORAL_TABLET | Freq: Two times a day (BID) | ORAL | 0 refills | Status: DC

## 2019-01-07 NOTE — Patient Instructions (Signed)
Take the prescribed progesterone pill daily for 10 days.  This should bring on a period.  Call if you do not have any bleeding after taking the medication or if irregular bleeding continues

## 2019-01-07 NOTE — Addendum Note (Signed)
Addended by: Nelva Nay on: 01/07/2019 11:18 AM   Modules accepted: Orders

## 2019-01-08 LAB — TSH: TSH: 3.64 mIU/L

## 2019-01-08 LAB — HCG, SERUM, QUALITATIVE: Preg, Serum: NEGATIVE

## 2019-01-08 LAB — FOLLICLE STIMULATING HORMONE: FSH: 6.3 m[IU]/mL

## 2019-01-08 LAB — PROLACTIN: Prolactin: 10.1 ng/mL

## 2019-01-12 LAB — PREGNANCY, URINE: Preg Test, Ur: NEGATIVE

## 2019-03-24 ENCOUNTER — Telehealth: Payer: Self-pay | Admitting: Family Medicine

## 2019-03-24 NOTE — Telephone Encounter (Signed)
Called pt let her know that Dr.Stallings would not be avai/ for that appt. We would like to get her  Rescheduled  her for Friday 08 . 14. 2020 at 12.00 20 minute slot per Dr.Stallings

## 2019-03-29 ENCOUNTER — Ambulatory Visit: Payer: Medicaid Other | Admitting: Family Medicine

## 2019-04-02 ENCOUNTER — Ambulatory Visit (INDEPENDENT_AMBULATORY_CARE_PROVIDER_SITE_OTHER): Payer: Medicaid Other | Admitting: Family Medicine

## 2019-04-02 ENCOUNTER — Other Ambulatory Visit: Payer: Self-pay

## 2019-04-02 ENCOUNTER — Other Ambulatory Visit (HOSPITAL_COMMUNITY)
Admission: RE | Admit: 2019-04-02 | Discharge: 2019-04-02 | Disposition: A | Discharge status: Home / Self Care | Payer: BLUE CROSS/BLUE SHIELD | Source: Ambulatory Visit | Attending: Family Medicine | Admitting: Family Medicine

## 2019-04-02 ENCOUNTER — Encounter: Payer: Self-pay | Admitting: Family Medicine

## 2019-04-02 VITALS — BP 116/79 | HR 86 | Temp 98.2°F | Resp 17 | Ht 65.0 in | Wt 227.3 lb

## 2019-04-02 DIAGNOSIS — Z0000 Encounter for general adult medical examination without abnormal findings: Secondary | ICD-10-CM

## 2019-04-02 DIAGNOSIS — Z113 Encounter for screening for infections with a predominantly sexual mode of transmission: Secondary | ICD-10-CM | POA: Insufficient documentation

## 2019-04-02 DIAGNOSIS — F322 Major depressive disorder, single episode, severe without psychotic features: Secondary | ICD-10-CM

## 2019-04-02 MED ORDER — CITALOPRAM HYDROBROMIDE 20 MG PO TABS: ORAL_TABLET | ORAL | 3 refills | Status: DC

## 2019-04-02 NOTE — Progress Notes (Signed)
New Patient Office Visit  Subjective:  Patient ID: Ann Fields, female    DOB: [birth year]  Age: 23 y.o. MRN: [id number]  CC:  Chief Complaint  Patient presents with  . New Patient (Initial Visit)    establish care.  Rash under left breast and is irritated per pt.  Trouble hearing per pt thinks both ears are clogged  . Depression    score 17    HPI Ann Fields presents for   Depression screen PHQ 2/9 04/02/2019  Decreased Interest 0  Down, Depressed, Hopeless 2  PHQ - 2 Score 2  Altered sleeping 3  Tired, decreased energy 3  Change in appetite 3  Feeling bad or failure about yourself  3  Trouble concentrating 2  Moving slowly or fidgety/restless 1  Suicidal thoughts 1  PHQ-9 Score 18  Difficult doing work/chores Very difficult   Patient reports that she has a history of depression She reports that she is a recent graduate and does not have a job which makes her mood worse She stats that she cannot sleep at all She states that she tires to keep busy and even if she is tired she won't go to sleep She states that a couple weeks ago she had suicidal thoughts and was very down about herself She states that she has an older sister that also has depression She reports that her partner knows that she has bad days and they can talk to get her mind of it Her partner is supportive and is understanding  She reports that she has amenorrhea and irregular menses  There is no impact on her mood from her periods but it makes her energy even lower.  She does not bleed heavily since getting her polypectomy.  She was getting therapy while on campus at [employer] She has a degree in supply chain mgmt   She is due for a physical exam    Past Medical History:  Diagnosis Date  . Endometriosis   . History of seizure    12-30-2017  per pt age 34---  x3 seizure's--  unknown cause after work-up done-- no seizure since  . Leiomyoma of uterus   . Menorrhagia   . Migraines   . Mild asthma      Past Surgical History:  Procedure Laterality Date  . DILATATION & CURETTAGE/HYSTEROSCOPY WITH MYOSURE N/A 01/02/2018   Procedure: DILATATION & CURETTAGE/HYSTEROSCOPY WITH MYOSURE;  Surgeon: Anastasio Auerbach, MD;  Location: [facility];  Service: Gynecology;  Laterality: N/A;  request to follow in [place] gyn block time requests one hour  . HYSTEROSCOPY W/ POLYPECTOMY  02/2017  . WISDOM TOOTH EXTRACTION  age 16    Family History  Problem Relation Age of Onset  . Diabetes Paternal Grandmother   . Hypertension Paternal Grandmother   . Depression Sister   . Cancer Maternal Grandmother        ovarian cancer  . Cancer Maternal Grandfather        pancreatic cancer  . Heart disease Paternal Grandfather     Social History   Socioeconomic History  . Marital status: Single    Spouse name: Not on file  . Number of children: Not on file  . Years of education: Not on file  . Highest education level: Not on file  Occupational History  . Not on file  Social Needs  . Financial resource strain: Very hard  . Food insecurity    Worry: Sometimes true  Inability: Sometimes true  . Transportation needs    Medical: No    Non-medical: No  Tobacco Use  . Smoking status: Never Smoker  . Smokeless tobacco: Never Used  Substance and Sexual Activity  . Alcohol use: Yes    Comment: Occasional  . Drug use: No  . Sexual activity: Yes    Birth control/protection: None    Comment: 1st intercourse 61 yo-5 partners  Lifestyle  . Physical activity    Days per week: 2 days    Minutes per session: 30 min  . Stress: To some extent  Relationships  . Social Herbalist on phone: Twice a week    Gets together: Not on file    Attends religious service: Never    Active member of club or organization: No    Attends meetings of clubs or organizations: Never    Relationship status: Living with partner  . Intimate partner violence    Fear of current or ex partner: No     Emotionally abused: No    Physically abused: No    Forced sexual activity: No  Other Topics Concern  . Not on file  Social History Narrative  . Not on file   Health Maintenance  ROS Review of Systems Review of Systems  Constitutional: Negative for activity change, appetite change, chills and fever.  HENT: Negative for congestion, nosebleeds, trouble swallowing and voice change.   Respiratory: Negative for cough, shortness of breath and wheezing.   Gastrointestinal: Negative for diarrhea, nausea and vomiting.  Genitourinary: Negative for difficulty urinating, dysuria, flank pain and hematuria.  Musculoskeletal: Negative for back pain, joint swelling and neck pain.  Neurological: Negative for dizziness, speech difficulty, light-headedness and numbness.  See HPI. All other review of systems negative.   Objective:   Today's Vitals: BP 116/79 (BP Location: Left Arm, Patient Position: Sitting, Cuff Size: Large)   Pulse 86   Temp 98.2 F (36.8 C) (Oral)   Resp 17   Ht 5\' 5"  (1.651 m)   Wt 227 lb 4.8 oz (103.1 kg)   LMP 03/15/2019 (Exact Date)   SpO2 97%   BMI 37.82 kg/m   Physical Exam Constitutional:      Appearance: Normal appearance.  HENT:     Head: Normocephalic and atraumatic.     Right Ear: Tympanic membrane normal.     Left Ear: Tympanic membrane normal.     Nose: Nose normal. No congestion.     Mouth/Throat:     Mouth: Mucous membranes are moist.  Eyes:     Extraocular Movements: Extraocular movements intact.     Conjunctiva/sclera: Conjunctivae normal.  Neck:     Musculoskeletal: Normal range of motion and neck supple.     Comments: No thyromegaly Cardiovascular:     Rate and Rhythm: Normal rate and regular rhythm.     Pulses: Normal pulses.     Heart sounds: No murmur.  Pulmonary:     Effort: Pulmonary effort is normal.     Breath sounds: Normal breath sounds. No stridor. No rhonchi.  Chest:     Chest wall: No tenderness.  Abdominal:     General:  Abdomen is flat. Bowel sounds are normal. There is no distension.     Palpations: Abdomen is soft. There is no mass.     Tenderness: There is no abdominal tenderness. There is no right CVA tenderness, left CVA tenderness, guarding or rebound.     Hernia: No hernia is present.  Musculoskeletal: Normal range of motion.        General: No swelling, tenderness, deformity or signs of injury.     Right lower leg: No edema.     Left lower leg: No edema.  Skin:    General: Skin is warm.     Capillary Refill: Capillary refill takes less than 2 seconds.  Neurological:     General: No focal deficit present.     Mental Status: She is alert and oriented to person, place, and time.  Psychiatric:        Mood and Affect: Mood normal.        Behavior: Behavior normal.        Thought Content: Thought content normal.        Judgment: Judgment normal.     Assessment & Plan:   Problem List Items Addressed This Visit    None    Visit Diagnoses    Encounter for health maintenance examination in adult    -  Primary Pt declined std screening as she recently had full testing Discussed age appropriate screenings    Major depressive disorder, single episode, severe ([place])    - discussed management of depression and advised exercise Discussed counseling through [facility] and other walk-in counseling services   Relevant Medications   citalopram (CELEXA) 20 MG tablet   Other Relevant Orders   CBC (Completed)   VITAMIN D 25 Hydroxy (Vit-D Deficiency, Fractures) (Completed)   TSH (Completed)   Screen for STD (sexually transmitted disease)    - verbal consent given   Relevant Orders   GC/Chlamydia probe amp ([place])not at [facility] (Completed)      Outpatient Encounter Medications as of 04/02/2019  Medication Sig  . acetaminophen (TYLENOL) 500 MG tablet Take 500 mg by mouth every 6 (six) hours as needed.  Marland Kitchen albuterol (PROVENTIL HFA;VENTOLIN HFA) 108 (90 BASE) MCG/ACT inhaler Inhale into the lungs every 6  (six) hours as needed for wheezing or shortness of breath.  Marland Kitchen ibuprofen (ADVIL,MOTRIN) 200 MG tablet Take 200 mg by mouth every 6 (six) hours as needed.  . citalopram (CELEXA) 20 MG tablet Take half tablet for one week then a whole tablet daily  . medroxyPROGESTERone (PROVERA) 10 MG tablet Take 1 tablet (10 mg total) by mouth 2 (two) times daily. (Patient not taking: Reported on 04/02/2019)   No facility-administered encounter medications on file as of 04/02/2019.     Follow-up: No follow-ups on file.   Forrest Moron, MD

## 2019-04-02 NOTE — Patient Instructions (Addendum)
[facility] Address: [address], [place], [place] [address]  Phone: [phone] A nonprofit organization supporting thousands in [place] with intellectual and developmental disabilities, mental illness, and substance use disorders.    [facility] in services available  Monday through Friday  or call for an appointment 8:30 a.m. to 12:00 p.m. ([place] and [place]) 1:00 p.m. to 2:30 p.m. ([facility] only) 2:00 p.m. to 3:30 p.m. [facility] only) [phone] [phone]  For more information email: [email or web address]     If you have lab work done today you will be contacted with your lab results within the next 2 weeks.  If you have not heard from [place] then please contact us. The fastest way to get your results is to register for My Chart.   IF you received an x-ray today, you will receive an invoice from [facility]. Please contact [facility] at [phone] with questions or concerns regarding your invoice.   IF you received labwork today, you will receive an invoice from [facility]. Please contact [facility] at [phone] with questions or concerns regarding your invoice.   Our billing staff will not be able to assist you with questions regarding bills from these companies.  You will be contacted with the lab results as soon as they are available. The fastest way to get your results is to activate your My Chart account. Instructions are located on the last page of this paperwork. If you have not heard from [place] regarding the results in 2 weeks, please contact this office.     Living With Depression Everyone experiences occasional disappointment, sadness, and loss in their lives. When you are feeling down, blue, or sad for at least 2 weeks in a row, it may mean that you have depression. Depression can affect your thoughts and feelings, relationships, daily activities, and physical health. It is  caused by changes in the way your brain functions. If you receive a diagnosis of depression, your health care provider will tell you which type of depression you have and what treatment options are available to you. If you are living with depression, there are ways to help you recover from it and also ways to prevent it from coming back. How to cope with lifestyle changes Coping with stress     Stress is your body's reaction to life changes and events, both good and bad. Stressful situations may include:  Getting married.  The death of a spouse.  Losing a job.  Retiring.  Having a baby. Stress can last just a few hours or it can be ongoing. Stress can play a major role in depression, so it is important to learn both how to cope with stress and how to think about it differently. Talk with your health care provider or a counselor if you would like to learn more about stress reduction. He or she may suggest some stress reduction techniques, such as:  Music therapy. This can include creating music or listening to music. Choose music that you enjoy and that inspires you.  Mindfulness-based meditation. This kind of meditation can be done while sitting or walking. It involves being aware of your normal breaths, rather than trying to control your breathing.  Centering prayer. This is a kind of meditation that involves focusing on a spiritual word or phrase. Choose a word, phrase, or sacred image that is meaningful to you and that brings you peace.  Deep breathing. To do this, expand your  stomach and inhale slowly through your nose. Hold your breath for 3-5 seconds, then exhale slowly, allowing your stomach muscles to relax.  Muscle relaxation. This involves intentionally tensing muscles then relaxing them. Choose a stress reduction technique that fits your lifestyle and personality. Stress reduction techniques take time and practice to develop. Set aside 5-15 minutes a day to do them.  Therapists can offer training in these techniques. The training may be covered by some insurance plans. Other things you can do to manage stress include:  Keeping a stress diary. This can help you learn what triggers your stress and ways to control your response.  Understanding what your limits are and saying no to requests or events that lead to a schedule that is too full.  Thinking about how you respond to certain situations. You may not be able to control everything, but you can control how you react.  Adding humor to your life by watching funny films or TV shows.  Making time for activities that help you relax and not feeling guilty about spending your time this way.  Medicines Your health care provider may suggest certain medicines if he or she feels that they will help improve your condition. Avoid using alcohol and other substances that may prevent your medicines from working properly (may interact). It is also important to:  Talk with your pharmacist or health care provider about all the medicines that you take, their possible side effects, and what medicines are safe to take together.  Make it your goal to take part in all treatment decisions (shared decision-making). This includes giving input on the side effects of medicines. It is best if shared decision-making with your health care provider is part of your total treatment plan. If your health care provider prescribes a medicine, you may not notice the full benefits of it for 4-8 weeks. Most people who are treated for depression need to be on medicine for at least 6-12 months after they feel better. If you are taking medicines as part of your treatment, do not stop taking medicines without first talking to your health care provider. You may need to have the medicine slowly decreased (tapered) over time to decrease the risk of harmful side effects. Relationships Your health care provider may suggest family therapy along with  individual therapy and drug therapy. While there may not be family problems that are causing you to feel depressed, it is still important to make sure your family learns as much as they can about your mental health. Having your family's support can help make your treatment successful. How to recognize changes in your condition Everyone has a different response to treatment for depression. Recovery from major depression happens when you have not had signs of major depression for two months. This may mean that you will start to:  Have more interest in doing activities.  Feel less hopeless than you did 2 months ago.  Have more energy.  Overeat less often, or have better or improving appetite.  Have better concentration. Your health care provider will work with you to decide the next steps in your recovery. It is also important to recognize when your condition is getting worse. Watch for these signs:  Having fatigue or low energy.  Eating too much or too little.  Sleeping too much or too little.  Feeling restless, agitated, or hopeless.  Having trouble concentrating or making decisions.  Having unexplained physical complaints.  Feeling irritable, angry, or aggressive. Get help as soon as  you or your family members notice these symptoms coming back. How to get support and help from others How to talk with friends and family members about your condition  Talking to friends and family members about your condition can provide you with one way to get support and guidance. Reach out to trusted friends or family members, explain your symptoms to them, and let them know that you are working with a health care provider to treat your depression. Financial resources Not all insurance plans cover mental health care, so it is important to check with your insurance carrier. If paying for co-pays or counseling services is a problem, search for a local or county mental health care center. They may be  able to offer public mental health care services at low or no cost when you are not able to see a private health care provider. If you are taking medicine for depression, you may be able to get the generic form, which may be less expensive. Some makers of prescription medicines also offer help to patients who cannot afford the medicines they need. Follow these instructions at home:   Get the right amount and quality of sleep.  Cut down on using caffeine, tobacco, alcohol, and other potentially harmful substances.  Try to exercise, such as walking or lifting small weights.  Take over-the-counter and prescription medicines only as told by your health care provider.  Eat a healthy diet that includes plenty of vegetables, fruits, whole grains, low-fat dairy products, and lean protein. Do not eat a lot of foods that are high in solid fats, added sugars, or salt.  Keep all follow-up visits as told by your health care provider. This is important. Contact a health care provider if:  You stop taking your antidepressant medicines, and you have any of these symptoms: ? Nausea. ? Headache. ? Feeling lightheaded. ? Chills and body aches. ? Not being able to sleep (insomnia).  You or your friends and family think your depression is getting worse. Get help right away if:  You have thoughts of hurting yourself or others. If you ever feel like you may hurt yourself or others, or have thoughts about taking your own life, get help right away. You can go to your nearest emergency department or call:  Your local emergency services (911 in the [place]  A suicide crisis helpline, such as the [facility] at (437)706-0916. This is open 24-hours a day. Summary  If you are living with depression, there are ways to help you recover from it and also ways to prevent it from coming back.  Work with your health care team to create a management plan that includes counseling, stress  management techniques, and healthy lifestyle habits. This information is not intended to replace advice given to you by your health care provider. Make sure you discuss any questions you have with your health care provider. Document Released: 07/08/2016 Document Revised: 11/27/2018 Document Reviewed: 07/08/2016 Elsevier Patient Education  2020 [employer].

## 2019-04-03 LAB — GC/CHLAMYDIA PROBE AMP (~~LOC~~) NOT AT ARMC
Chlamydia: NEGATIVE
Neisseria Gonorrhea: NEGATIVE

## 2019-04-03 LAB — CBC
Hematocrit: 39.3 % (ref 34.0–46.6)
Hemoglobin: 12.8 g/dL (ref 11.1–15.9)
MCH: 27.1 pg (ref 26.6–33.0)
MCHC: 32.6 g/dL (ref 31.5–35.7)
MCV: 83 fL (ref 79–97)
Platelets: 251 10*3/uL (ref 150–450)
RBC: 4.72 x10E6/uL (ref 3.77–5.28)
RDW: 14.2 % (ref 11.7–15.4)
WBC: 6.2 10*3/uL (ref 3.4–10.8)

## 2019-04-03 LAB — VITAMIN D 25 HYDROXY (VIT D DEFICIENCY, FRACTURES): Vit D, 25-Hydroxy: 10 ng/mL — ABNORMAL LOW (ref 30.0–100.0)

## 2019-04-03 LAB — TSH: TSH: 2.84 u[IU]/mL (ref 0.450–4.500)

## 2019-04-03 MED ORDER — VITAMIN D (ERGOCALCIFEROL) 1.25 MG (50000 UNIT) PO CAPS: 50000.0000 [IU] | ORAL_CAPSULE | ORAL | 1 refills | Status: DC

## 2019-05-10 ENCOUNTER — Encounter: Payer: Self-pay | Admitting: Gynecology

## 2019-07-05 ENCOUNTER — Ambulatory Visit (INDEPENDENT_AMBULATORY_CARE_PROVIDER_SITE_OTHER): Payer: BC Managed Care – PPO | Admitting: Family Medicine

## 2019-07-05 ENCOUNTER — Other Ambulatory Visit: Payer: Self-pay

## 2019-07-05 ENCOUNTER — Encounter: Payer: Self-pay | Admitting: Family Medicine

## 2019-07-05 VITALS — BP 126/80 | HR 87 | Temp 98.9°F | Ht 65.0 in | Wt 227.8 lb

## 2019-07-05 DIAGNOSIS — N926 Irregular menstruation, unspecified: Secondary | ICD-10-CM

## 2019-07-05 DIAGNOSIS — Z3401 Encounter for supervision of normal first pregnancy, first trimester: Secondary | ICD-10-CM

## 2019-07-05 LAB — POCT URINE PREGNANCY: Preg Test, Ur: POSITIVE — AB

## 2019-07-05 MED ORDER — PRENATAL DHA 200 MG PO CAPS: 1.0000 | ORAL_CAPSULE | Freq: Every day | ORAL | 11 refills | Status: DC

## 2019-07-05 NOTE — Progress Notes (Signed)
Established Patient Office Visit  Subjective:  Patient ID: Ann Fields, female    DOB: [birth year]  Age: 23 y.o. MRN: [id number]  CC:  Chief Complaint  Patient presents with  . missed cycle     3 Pos Home test   . tender breast    HPI Ann Fields presents for   Pregnancy confirmation Patient's last menstrual period was 05/04/2019. She reports that she took tests and home that were positive She would like a prenatal vitamin  She stopped the celexa for 11 days and had a panic attack She is wondering if she should stop taking  She denies suicidal thoughts Her mood seems better  This is her first pregnancy G1P0   Past Medical History:  Diagnosis Date  . Endometriosis   . History of seizure    12-30-2017  per pt age 38---  x3 seizure's--  unknown cause after work-up done-- no seizure since  . Leiomyoma of uterus   . Menorrhagia   . Migraines   . Mild asthma     Past Surgical History:  Procedure Laterality Date  . DILATATION & CURETTAGE/HYSTEROSCOPY WITH MYOSURE N/A 01/02/2018   Procedure: DILATATION & CURETTAGE/HYSTEROSCOPY WITH MYOSURE;  Surgeon: Anastasio Auerbach, MD;  Location: [facility];  Service: Gynecology;  Laterality: N/A;  request to follow in [facility] block time requests one hour  . HYSTEROSCOPY W/ POLYPECTOMY  02/2017  . WISDOM TOOTH EXTRACTION  age 54    Family History  Problem Relation Age of Onset  . Diabetes Paternal Grandmother   . Hypertension Paternal Grandmother   . Depression Sister   . Cancer Maternal Grandmother        ovarian cancer  . Cancer Maternal Grandfather        pancreatic cancer  . Heart disease Paternal Grandfather     Social History   Socioeconomic History  . Marital status: Single    Spouse name: Not on file  . Number of children: Not on file  . Years of education: Not on file  . Highest education level: Not on file  Occupational History  . Not on file  Social Needs  . Financial  resource strain: Very hard  . Food insecurity    Worry: Sometimes true    Inability: Sometimes true  . Transportation needs    Medical: No    Non-medical: No  Tobacco Use  . Smoking status: Never Smoker  . Smokeless tobacco: Never Used  Substance and Sexual Activity  . Alcohol use: Yes    Comment: Occasional  . Drug use: No  . Sexual activity: Yes    Birth control/protection: None    Comment: 1st intercourse 17 yo-5 partners  Lifestyle  . Physical activity    Days per week: 2 days    Minutes per session: 30 min  . Stress: To some extent  Relationships  . Social Herbalist on phone: Twice a week    Gets together: Not on file    Attends religious service: Never    Active member of club or organization: No    Attends meetings of clubs or organizations: Never    Relationship status: Living with partner  . Intimate partner violence    Fear of current or ex partner: No    Emotionally abused: No    Physically abused: No    Forced sexual activity: No  Other Topics Concern  . Not on file  Social History Narrative  .  Not on file    Outpatient Medications Prior to Visit  Medication Sig Dispense Refill  . acetaminophen (TYLENOL) 500 MG tablet Take 500 mg by mouth every 6 (six) hours as needed.    Marland Kitchen albuterol (PROVENTIL HFA;VENTOLIN HFA) 108 (90 BASE) MCG/ACT inhaler Inhale into the lungs every 6 (six) hours as needed for wheezing or shortness of breath.    . citalopram (CELEXA) 20 MG tablet Take half tablet for one week then a whole tablet daily 30 tablet 3  . ibuprofen (ADVIL,MOTRIN) 200 MG tablet Take 200 mg by mouth every 6 (six) hours as needed.    . Vitamin D, Ergocalciferol, (DRISDOL) 1.25 MG (50000 UT) CAPS capsule Take 1 capsule (50,000 Units total) by mouth every 7 (seven) days. 12 capsule 1  . medroxyPROGESTERone (PROVERA) 10 MG tablet Take 1 tablet (10 mg total) by mouth 2 (two) times daily. 10 tablet 0   No facility-administered medications prior to visit.      Allergies  Allergen Reactions  . Latex Rash    ROS Review of Systems Review of Systems  Constitutional: Negative for activity change, appetite change, chills and fever.  HENT: Negative for congestion, nosebleeds, trouble swallowing and voice change.   Respiratory: Negative for cough, shortness of breath and wheezing.   Gastrointestinal: Negative for diarrhea, nausea and vomiting.  Genitourinary: Negative for difficulty urinating, dysuria, flank pain and hematuria.  Musculoskeletal: Negative for back pain, joint swelling and neck pain.  Neurological: Negative for dizziness, speech difficulty, light-headedness and numbness.  See HPI. All other review of systems negative.     Objective:    Physical Exam  BP 126/80   Pulse 87   Temp 98.9 F (37.2 C) (Oral)   Ht 5\' 5"  (1.651 m)   Wt 227 lb 12.8 oz (103.3 kg)   LMP 05/04/2019   SpO2 99%   BMI 37.91 kg/m  Wt Readings from Last 3 Encounters:  07/05/19 227 lb 12.8 oz (103.3 kg)  04/02/19 227 lb 4.8 oz (103.1 kg)  08/10/18 218 lb (98.9 kg)    Physical Exam  Constitutional: Oriented to person, place, and time. Appears well-developed and well-nourished.  HENT:  Head: Normocephalic and atraumatic.  Eyes: Conjunctivae and EOM are normal.  Neck: supple, no thyromegaly Cardiovascular: Normal rate, regular rhythm, normal heart sounds and intact distal pulses.  No murmur heard. Pulmonary/Chest: Effort normal and breath sounds normal. No stridor. No respiratory distress. Has no wheezes.  Neurological: Is alert and oriented to person, place, and time.  Skin: Skin is warm. Capillary refill takes less than 2 seconds.  Psychiatric: Has a normal mood and affect. Behavior is normal. Judgment and thought content normal.   Health Maintenance Due  Topic Date Due  . HIV Screening  08/23/2009  . TETANUS/TDAP  08/23/2013  . INFLUENZA VACCINE  03/20/2019    There are no preventive care reminders to display for this patient.  Lab  Results  Component Value Date   TSH 2.840 04/02/2019   Lab Results  Component Value Date   WBC 6.2 04/02/2019   HGB 12.8 04/02/2019   HCT 39.3 04/02/2019   MCV 83 04/02/2019   PLT 251 04/02/2019   Lab Results  Component Value Date   NA 138 08/10/2018   K 4.5 08/10/2018   CO2 23 08/10/2018   GLUCOSE 93 08/10/2018   BUN 9 08/10/2018   CREATININE 0.70 08/10/2018   BILITOT 0.5 08/10/2018   ALKPHOS 50 06/09/2017   AST 16 08/10/2018   ALT  12 08/10/2018   PROT 6.8 08/10/2018   ALBUMIN 3.7 06/09/2017   CALCIUM 9.1 08/10/2018   ANIONGAP 8 06/09/2017   Lab Results  Component Value Date   CHOL 189 08/10/2018   Lab Results  Component Value Date   HDL 49 (L) 08/10/2018   Lab Results  Component Value Date   LDLCALC 112 (H) 08/10/2018   Lab Results  Component Value Date   TRIG 165 (H) 08/10/2018   Lab Results  Component Value Date   CHOLHDL 3.9 08/10/2018   No results found for: HGBA1C    Assessment & Plan:   Problem List Items Addressed This Visit    None    Visit Diagnoses    Missed period    -  Primary   Relevant Orders   POCT urine pregnancy (Completed)   Primigravida in first trimester       Relevant Medications   Docosahexaenoic Acid (PRENATAL DHA) 200 MG CAPS   Other Relevant Orders   Ambulatory referral to Obstetrics / Gynecology     Advised to establish with OB Discussed pregnancy and advised to continue celexa as worsening mood impacts the pregnancy with a worse outcome than celexa which is category C Advised pt to take Prenatal vitamin and DHA   Meds ordered this encounter  Medications  . Docosahexaenoic Acid (PRENATAL DHA) 200 MG CAPS    Sig: Take 1 capsule (200 mg total) by mouth daily.    Dispense:  30 capsule    Refill:  11    Follow-up: No follow-ups on file.    Forrest Moron, MD

## 2019-07-05 NOTE — Patient Instructions (Addendum)
° ° ° °  If you have lab work done today you will be contacted with your lab results within the next 2 weeks.  If you have not heard from us then please contact us. The fastest way to get your results is to register for My Chart. ° ° °IF you received an x-ray today, you will receive an invoice from [facility]. Please contact [facility] at [phone] with questions or concerns regarding your invoice.  ° °IF you received labwork today, you will receive an invoice from [facility]. Please contact [facility] at [phone] with questions or concerns regarding your invoice.  ° °Our billing staff will not be able to assist you with questions regarding bills from these companies. ° °You will be contacted with the lab results as soon as they are available. The fastest way to get your results is to activate your My Chart account. Instructions are located on the last page of this paperwork. If you have not heard from us regarding the results in 2 weeks, please contact this office. °  ° ° ° °

## 2019-07-09 ENCOUNTER — Ambulatory Visit: Payer: Medicaid Other | Admitting: Gynecology

## 2019-07-12 ENCOUNTER — Inpatient Hospital Stay (HOSPITAL_COMMUNITY): Payer: BC Managed Care – PPO

## 2019-07-12 ENCOUNTER — Encounter (HOSPITAL_COMMUNITY): Payer: Self-pay

## 2019-07-12 ENCOUNTER — Inpatient Hospital Stay (HOSPITAL_COMMUNITY)
Admission: AD | Admit: 2019-07-12 | Discharge: 2019-07-12 | Disposition: A | Discharge status: Home / Self Care | Payer: BC Managed Care – PPO | Attending: Obstetrics & Gynecology | Admitting: Obstetrics & Gynecology

## 2019-07-12 ENCOUNTER — Other Ambulatory Visit: Payer: Self-pay

## 2019-07-12 DIAGNOSIS — O208 Other hemorrhage in early pregnancy: Secondary | ICD-10-CM | POA: Diagnosis not present

## 2019-07-12 DIAGNOSIS — O99351 Diseases of the nervous system complicating pregnancy, first trimester: Secondary | ICD-10-CM | POA: Diagnosis not present

## 2019-07-12 DIAGNOSIS — O26891 Other specified pregnancy related conditions, first trimester: Secondary | ICD-10-CM | POA: Diagnosis not present

## 2019-07-12 DIAGNOSIS — Z3A09 9 weeks gestation of pregnancy: Secondary | ICD-10-CM | POA: Insufficient documentation

## 2019-07-12 DIAGNOSIS — M545 Low back pain, unspecified: Secondary | ICD-10-CM

## 2019-07-12 DIAGNOSIS — R109 Unspecified abdominal pain: Secondary | ICD-10-CM

## 2019-07-12 DIAGNOSIS — Z3A08 8 weeks gestation of pregnancy: Secondary | ICD-10-CM

## 2019-07-12 DIAGNOSIS — Z79899 Other long term (current) drug therapy: Secondary | ICD-10-CM | POA: Diagnosis not present

## 2019-07-12 DIAGNOSIS — O3680X0 Pregnancy with inconclusive fetal viability, not applicable or unspecified: Secondary | ICD-10-CM

## 2019-07-12 DIAGNOSIS — R102 Pelvic and perineal pain: Secondary | ICD-10-CM | POA: Diagnosis not present

## 2019-07-12 LAB — CBC
HCT: 36.4 % (ref 36.0–46.0)
Hemoglobin: 12.2 g/dL (ref 12.0–15.0)
MCH: 28.6 pg (ref 26.0–34.0)
MCHC: 33.5 g/dL (ref 30.0–36.0)
MCV: 85.4 fL (ref 80.0–100.0)
Platelets: 261 10*3/uL (ref 150–400)
RBC: 4.26 MIL/uL (ref 3.87–5.11)
RDW: 13 % (ref 11.5–15.5)
WBC: 10.9 10*3/uL — ABNORMAL HIGH (ref 4.0–10.5)
nRBC: 0 % (ref 0.0–0.2)

## 2019-07-12 LAB — URINALYSIS, ROUTINE W REFLEX MICROSCOPIC
Bilirubin Urine: NEGATIVE
Glucose, UA: NEGATIVE mg/dL
Hgb urine dipstick: NEGATIVE
Ketones, ur: NEGATIVE mg/dL
Leukocytes,Ua: NEGATIVE
Nitrite: NEGATIVE
Protein, ur: NEGATIVE mg/dL
Specific Gravity, Urine: 1.015 (ref 1.005–1.030)
pH: 7 (ref 5.0–8.0)

## 2019-07-12 LAB — GC/CHLAMYDIA PROBE AMP (~~LOC~~) NOT AT ARMC
Chlamydia: NEGATIVE
Comment: NEGATIVE
Comment: NORMAL
Neisseria Gonorrhea: NEGATIVE

## 2019-07-12 LAB — WET PREP, GENITAL
Clue Cells Wet Prep HPF POC: NONE SEEN
Sperm: NONE SEEN
Trich, Wet Prep: NONE SEEN

## 2019-07-12 LAB — ABO/RH: ABO/RH(D): A POS

## 2019-07-12 LAB — HCG, QUANTITATIVE, PREGNANCY: hCG, Beta Chain, Quant, S: 79774 m[IU]/mL — ABNORMAL HIGH (ref <5)

## 2019-07-12 MED ORDER — TERCONAZOLE 0.8 % VA CREA: 1.0000 | TOPICAL_CREAM | Freq: Every day | VAGINAL | 0 refills | Status: DC

## 2019-07-12 NOTE — MAU Provider Note (Signed)
History     CSN: [id number]  Arrival date and time: 07/12/19 [id number]   First Provider Initiated Contact with Patient 07/12/19 0210      Chief Complaint  Patient presents with  . Pelvic Pain   Ann Fields is a 23 y.o. G1P0 at [redacted]w[redacted]d by definite LMP who plans to start care at [facility].  She presents today for Pelvic, Back, and Abdominal Pain that started about one hour ago.  Patient states the pain has gotten progressively worse in all areas and is now a 9/10.  She reports that the back pain is constant, but that the pelvic and abdominal pain is intermittent is nature.  She describes the lower back pain as a shooting pain, pinching or stabbing pain in the abdomen, and cramping pain in the pelvis.  Patient reports that she has not taken anything for the pain, but notes that certain positions worsen the pain in different areas.  She denies any relieving factors including walking and standing.  She denies vaginal bleeding or discharge or sexual activity in the last 3 days.      OB History    Gravida  1   Para  0   Term  0   Preterm  0   AB  0   Living  0     SAB  0   TAB  0   Ectopic  0   Multiple  0   Live Births  0           Past Medical History:  Diagnosis Date  . Endometriosis   . History of seizure    12-30-2017  per pt age 64---  x3 seizure's--  unknown cause after work-up done-- no seizure since  . Leiomyoma of uterus   . Menorrhagia   . Migraines   . Mild asthma     Past Surgical History:  Procedure Laterality Date  . DILATATION & CURETTAGE/HYSTEROSCOPY WITH MYOSURE N/A 01/02/2018   Procedure: DILATATION & CURETTAGE/HYSTEROSCOPY WITH MYOSURE;  Surgeon: Anastasio Auerbach, MD;  Location: [place];  Service: Gynecology;  Laterality: N/A;  request to follow in [place] gyn block time requests one hour  . HYSTEROSCOPY W/ POLYPECTOMY  02/2017  . WISDOM TOOTH EXTRACTION  age 7    Family History  Problem Relation Age of Onset  .  Diabetes Paternal Grandmother   . Hypertension Paternal Grandmother   . Depression Sister   . Cancer Maternal Grandmother        ovarian cancer  . Cancer Maternal Grandfather        pancreatic cancer  . Heart disease Paternal Grandfather     Social History   Tobacco Use  . Smoking status: Never Smoker  . Smokeless tobacco: Never Used  Substance Use Topics  . Alcohol use: Not Currently  . Drug use: No    Allergies:  Allergies  Allergen Reactions  . Latex Rash    Medications Prior to Admission  Medication Sig Dispense Refill Last Dose  . citalopram (CELEXA) 20 MG tablet Take half tablet for one week then a whole tablet daily 30 tablet 3 Past Month at Unknown time  . Docosahexaenoic Acid (PRENATAL DHA) 200 MG CAPS Take 1 capsule (200 mg total) by mouth daily. 30 capsule 11 07/11/2019 at Unknown time  . Vitamin D, Ergocalciferol, (DRISDOL) 1.25 MG (50000 UT) CAPS capsule Take 1 capsule (50,000 Units total) by mouth every 7 (seven) days. 12 capsule 1 07/11/2019 at Unknown time  .  acetaminophen (TYLENOL) 500 MG tablet Take 500 mg by mouth every 6 (six) hours as needed.     Marland Kitchen albuterol (PROVENTIL HFA;VENTOLIN HFA) 108 (90 BASE) MCG/ACT inhaler Inhale into the lungs every 6 (six) hours as needed for wheezing or shortness of breath.     Marland Kitchen ibuprofen (ADVIL,MOTRIN) 200 MG tablet Take 200 mg by mouth every 6 (six) hours as needed.       Review of Systems  Constitutional: Negative for chills and fever.  Respiratory: Negative for cough and shortness of breath.   Gastrointestinal: Positive for abdominal pain. Negative for constipation, diarrhea, nausea and vomiting.  Genitourinary: Positive for pelvic pain. Negative for difficulty urinating, dysuria, vaginal bleeding and vaginal discharge.  Musculoskeletal: Positive for back pain.  Neurological: Positive for dizziness (That started with pain onset, but none currently. ). Negative for light-headedness and headaches.   Physical Exam    Blood pressure 122/87, pulse 80, temperature 99.3 F (37.4 C), resp. rate 19, height 5\' 5"  (1.651 m), weight 105 kg, last menstrual period 05/04/2019.  Physical Exam  Constitutional: She is oriented to person, place, and time.  Obese  HENT:  Head: Normocephalic and atraumatic.  Eyes: Conjunctivae are normal.  Neck: Normal range of motion.  Cardiovascular: Normal rate, regular rhythm and normal heart sounds.  Respiratory: Effort normal and breath sounds normal.  GI: Soft. Bowel sounds are normal. There is abdominal tenderness in the right upper quadrant, right lower quadrant, periumbilical area, suprapubic area and left lower quadrant. There is no rigidity, no rebound and no guarding.  Genitourinary: Cervix exhibits friability. Cervix exhibits no motion tenderness and no discharge.    Vaginal discharge present.     No vaginal bleeding.  No bleeding in the vagina.    Genitourinary Comments: Speculum Exam: -Normal External Genitalia: Non tender, no apparent discharge at introitus.  -Vaginal Vault: Pink mucosa with good rugae. Scant amt thick white mucoid discharge -wet prep collected -Cervix:Pink, no lesions, cysts, or polyps.  Appears closed. No active bleeding from os, but friable-GC/CT collected -Bimanual Exam:  Difficult to assess d/t patient panus.      Musculoskeletal: Normal range of motion.  Neurological: She is oriented to person, place, and time.  Psychiatric: She has a normal mood and affect. Her behavior is normal.    MAU Course  Procedures Results for orders placed or performed during the hospital encounter of 07/12/19 (from the past 24 hour(s))  Urinalysis, Routine w reflex microscopic     Status: Abnormal   Collection Time: 07/12/19  1:56 AM  Result Value Ref Range   Color, Urine YELLOW YELLOW   APPearance HAZY (A) CLEAR   Specific Gravity, Urine 1.015 1.005 - 1.030   pH 7.0 5.0 - 8.0   Glucose, UA NEGATIVE NEGATIVE mg/dL   Hgb urine dipstick NEGATIVE NEGATIVE    Bilirubin Urine NEGATIVE NEGATIVE   Ketones, ur NEGATIVE NEGATIVE mg/dL   Protein, ur NEGATIVE NEGATIVE mg/dL   Nitrite NEGATIVE NEGATIVE   Leukocytes,Ua NEGATIVE NEGATIVE  Wet prep, genital     Status: Abnormal   Collection Time: 07/12/19  2:27 AM   Specimen: PATH Cytology Cervicovaginal Ancillary Only  Result Value Ref Range   Yeast Wet Prep HPF POC PRESENT (A) NONE SEEN   Trich, Wet Prep NONE SEEN NONE SEEN   Clue Cells Wet Prep HPF POC NONE SEEN NONE SEEN   WBC, Wet Prep HPF POC MODERATE (A) NONE SEEN   Sperm NONE SEEN   CBC     Status:  Abnormal   Collection Time: 07/12/19  2:33 AM  Result Value Ref Range   WBC 10.9 (H) 4.0 - 10.5 K/uL   RBC 4.26 3.87 - 5.11 MIL/uL   Hemoglobin 12.2 12.0 - 15.0 g/dL   HCT 36.4 36.0 - 46.0 %   MCV 85.4 80.0 - 100.0 fL   MCH 28.6 26.0 - 34.0 pg   MCHC 33.5 30.0 - 36.0 g/dL   RDW 13.0 11.5 - 15.5 %   Platelets 261 150 - 400 K/uL   nRBC 0.0 0.0 - 0.2 %  ABO/Rh     Status: None   Collection Time: 07/12/19  2:33 AM  Result Value Ref Range   ABO/RH(D) A POS    No rh immune globuloin      NOT A RH IMMUNE GLOBULIN CANDIDATE, PT RH POSITIVE Performed at [facility] [address] [place], [place] [address]    US Ob Comp Less 14 Wks  Result Date: 07/12/2019 CLINICAL DATA:  Pelvic pain EXAM: OBSTETRIC <14 WK ULTRASOUND TECHNIQUE: Transabdominal ultrasound was performed for evaluation of the gestation as well as the maternal uterus and adnexal regions. COMPARISON:  None. FINDINGS: Intrauterine gestational sac: Present Yolk sac:  Present Embryo:  Present Cardiac Activity: Present Heart Rate: 179 bpm CRL:   20.2 mm   8 w 4 d                  [place] EDC: 02/17/2020 Subchorionic hemorrhage:  Small subchorionic hemorrhage is noted. Maternal uterus/adnexae: Ovaries are within normal limits. No free fluid in the pelvis is seen. IMPRESSION: Single live intrauterine gestation at 8 weeks 4 days. Small subchorionic hemorrhage is noted. Electronically Signed    By: Inez Catalina M.D.   On: 07/12/2019 03:03     MDM Pelvic Exam; Wet Prep and GC/CT Labs: UA, UPT, CBC, hCG, T&S Ultrasound Assessment and Plan  23 year old,  G1P0 at 9.6weeks Pregnancy of Unknown Anatomical Location Abdominal Pain Pelvic Pain Back Pain   -Reviewed POC with patient. -Patient offered and declines pain medication stating "I will just bear with it for now." -Exam performed and findings discussed.  -Informed that scant amt of discharge, but appears somewhat curdy c/w yeast infection.  However, will send to lab for confirmation. -Cultures collected and pending.  -Labs ordered. -Informed that will send for [place] and await results.   Maryann Conners MSN, CNM 07/12/2019, 2:10 AM   Reassessment (3:04 AM) SIUP at 8.4 weeks Candidiasis of Vagina [facility]  -Wet prep returns significant for yeast. -[place] returns as above with small [facility]. -Results discussed with patient. -Reviewed change in EDD based on US findings.  -Education provided regarding [facility] including how it develops, increased risk of bleeding, and associated discomforts.  -Bleeding precautions given. -Patient questions if [facility] could have been causing her abdominal pain.  Informed that that was unlikely considering that she is not actively bleeding.  However, informed that cramping could cause subsequent back and abdominal pain.  -Patient reports abdominal, back, and pelvic pain is improving. Of note no medication was given.   -Rx for Terzol 3 sent to [facility] on [address] per patient request. -Patient instructed to proceed with care at [facility] as scheduled. -Reviewed medication list and patient informed that she should discontinue usage of ibuprofen for pain. -Discussed usage of tylenol, as labeled, for pain management.  -No other questions or concerns.  -Encouraged to call primary ob or return to MAU if symptoms worsen or with the onset  of new symptoms. -Discharged to home in stable condition.  Maryann Conners MSN,  CNM Advanced Practice Provider, Center for [facility]

## 2019-07-12 NOTE — MAU Note (Signed)
Patient presents with lower abdominal and pelvic pain and back pain that has increased.  No vb/discharge.  Has not taken anything for the pain.  Patient plans to go to [facility] for pnc-first appointment 12/11.

## 2019-07-12 NOTE — Discharge Instructions (Signed)

## 2019-07-29 ENCOUNTER — Inpatient Hospital Stay (HOSPITAL_COMMUNITY): Payer: BC Managed Care – PPO

## 2019-07-29 ENCOUNTER — Inpatient Hospital Stay (HOSPITAL_COMMUNITY)
Admission: AD | Admit: 2019-07-29 | Discharge: 2019-07-29 | Disposition: A | Discharge status: Home / Self Care | Payer: BC Managed Care – PPO | Attending: Obstetrics & Gynecology | Admitting: Obstetrics & Gynecology

## 2019-07-29 ENCOUNTER — Encounter (HOSPITAL_COMMUNITY): Payer: Self-pay | Admitting: Obstetrics & Gynecology

## 2019-07-29 DIAGNOSIS — O468X1 Other antepartum hemorrhage, first trimester: Secondary | ICD-10-CM | POA: Diagnosis not present

## 2019-07-29 DIAGNOSIS — O26851 Spotting complicating pregnancy, first trimester: Secondary | ICD-10-CM | POA: Insufficient documentation

## 2019-07-29 DIAGNOSIS — B373 Candidiasis of vulva and vagina: Secondary | ICD-10-CM | POA: Insufficient documentation

## 2019-07-29 DIAGNOSIS — O4691 Antepartum hemorrhage, unspecified, first trimester: Secondary | ICD-10-CM

## 2019-07-29 DIAGNOSIS — O98811 Other maternal infectious and parasitic diseases complicating pregnancy, first trimester: Secondary | ICD-10-CM | POA: Insufficient documentation

## 2019-07-29 DIAGNOSIS — O418X10 Other specified disorders of amniotic fluid and membranes, first trimester, not applicable or unspecified: Secondary | ICD-10-CM | POA: Insufficient documentation

## 2019-07-29 DIAGNOSIS — Z79899 Other long term (current) drug therapy: Secondary | ICD-10-CM | POA: Insufficient documentation

## 2019-07-29 DIAGNOSIS — O418X90 Other specified disorders of amniotic fluid and membranes, unspecified trimester, not applicable or unspecified: Secondary | ICD-10-CM

## 2019-07-29 DIAGNOSIS — Z3A11 11 weeks gestation of pregnancy: Secondary | ICD-10-CM | POA: Diagnosis not present

## 2019-07-29 DIAGNOSIS — O4690 Antepartum hemorrhage, unspecified, unspecified trimester: Secondary | ICD-10-CM

## 2019-07-29 DIAGNOSIS — O208 Other hemorrhage in early pregnancy: Secondary | ICD-10-CM | POA: Diagnosis not present

## 2019-07-29 DIAGNOSIS — B3731 Acute candidiasis of vulva and vagina: Secondary | ICD-10-CM

## 2019-07-29 LAB — URINALYSIS, ROUTINE W REFLEX MICROSCOPIC
Bilirubin Urine: NEGATIVE
Glucose, UA: NEGATIVE mg/dL
Ketones, ur: NEGATIVE mg/dL
Leukocytes,Ua: NEGATIVE
Nitrite: NEGATIVE
Protein, ur: NEGATIVE mg/dL
Specific Gravity, Urine: 1.014 (ref 1.005–1.030)
pH: 6 (ref 5.0–8.0)

## 2019-07-29 LAB — WET PREP, GENITAL
Clue Cells Wet Prep HPF POC: NONE SEEN
Sperm: NONE SEEN
Trich, Wet Prep: NONE SEEN

## 2019-07-29 NOTE — MAU Note (Signed)
Patient presents for vaginal bleeding, was seen at 8 weeks and told she had an IUP w/ FHT~179 and that she has a subchorionic hematoma.  States she was not having bleeding then, the bleeding only started tonight-small amount.  No clots/tissue seen.  States her first appointment is scheduled for this upcoming Friday.  No pain.

## 2019-07-29 NOTE — Discharge Instructions (Signed)

## 2019-07-29 NOTE — MAU Provider Note (Signed)
History     CSN: [id number]  Arrival date and time: 07/29/19 0207   First Provider Initiated Contact with Patient 07/29/19 0324      Chief Complaint  Patient presents with  . Vaginal Bleeding   Ann Fields is a 23 y.o. G1P0 at [redacted]w[redacted]d who plans to receive care at [facility].  She presents today for Vaginal Bleeding.  She states she had some light pink bleeding that started around 1245.  She states that she wore a pad to the MAU, but it did not have blood on it.  However, she states that when she wipes she notices "a little bit."  She reports intermittent cramping and states "it's not bad."  She further states that since she started bleeding she hasn't had any cramping, but goes on to say "maybe one or two."  Patient denies sexual activity in the past 24 hours.  Patient reports that she has not taken her medication for her known yeast infection stating that she didn't know if it was safe or not.      OB History    Gravida  1   Para  0   Term  0   Preterm  0   AB  0   Living  0     SAB  0   TAB  0   Ectopic  0   Multiple  0   Live Births  0           Past Medical History:  Diagnosis Date  . Endometriosis   . History of seizure    12-30-2017  per pt age 38---  x3 seizure's--  unknown cause after work-up done-- no seizure since  . Leiomyoma of uterus   . Menorrhagia   . Migraines   . Mild asthma     Past Surgical History:  Procedure Laterality Date  . DILATATION & CURETTAGE/HYSTEROSCOPY WITH MYOSURE N/A 01/02/2018   Procedure: DILATATION & CURETTAGE/HYSTEROSCOPY WITH MYOSURE;  Surgeon: Anastasio Auerbach, MD;  Location: [place];  Service: Gynecology;  Laterality: N/A;  request to follow in [place] gyn block time requests one hour  . HYSTEROSCOPY W/ POLYPECTOMY  02/2017  . WISDOM TOOTH EXTRACTION  age 1    Family History  Problem Relation Age of Onset  . Diabetes Paternal Grandmother   . Hypertension Paternal Grandmother   .  Depression Sister   . Cancer Maternal Grandmother        ovarian cancer  . Cancer Maternal Grandfather        pancreatic cancer  . Heart disease Paternal Grandfather     Social History   Tobacco Use  . Smoking status: Never Smoker  . Smokeless tobacco: Never Used  Substance Use Topics  . Alcohol use: Not Currently  . Drug use: No    Allergies:  Allergies  Allergen Reactions  . Latex Rash    Medications Prior to Admission  Medication Sig Dispense Refill Last Dose  . acetaminophen (TYLENOL) 500 MG tablet Take 500 mg by mouth every 6 (six) hours as needed.   07/29/2019 at Unknown time  . Docosahexaenoic Acid (PRENATAL DHA) 200 MG CAPS Take 1 capsule (200 mg total) by mouth daily. 30 capsule 11 07/29/2019 at Unknown time  . albuterol (PROVENTIL HFA;VENTOLIN HFA) 108 (90 BASE) MCG/ACT inhaler Inhale into the lungs every 6 (six) hours as needed for wheezing or shortness of breath.     . citalopram (CELEXA) 20 MG tablet Take half tablet for one  week then a whole tablet daily 30 tablet 3   . terconazole (TERAZOL 3) 0.8 % vaginal cream Place 1 applicator vaginally at bedtime. 20 g 0   . Vitamin D, Ergocalciferol, (DRISDOL) 1.25 MG (50000 UT) CAPS capsule Take 1 capsule (50,000 Units total) by mouth every 7 (seven) days. 12 capsule 1     Review of Systems  Constitutional: Negative for chills and fever.  Eyes: Negative for visual disturbance.  Respiratory: Negative for cough and shortness of breath.   Gastrointestinal: Positive for abdominal pain (Cramping). Negative for constipation, diarrhea, nausea and vomiting.  Genitourinary: Positive for vaginal bleeding. Negative for difficulty urinating, dysuria and vaginal discharge.  Musculoskeletal: Positive for back pain.  Neurological: Positive for headaches ("Has often" None currently). Negative for dizziness and light-headedness.   Physical Exam   Blood pressure 134/72, pulse 92, temperature 99.1 F (37.3 C), resp. rate 19, weight  105 kg, last menstrual period 05/04/2019.  Physical Exam  Constitutional: She is oriented to person, place, and time. She appears well-developed and well-nourished.  HENT:  Head: Normocephalic and atraumatic.  Eyes: Conjunctivae are normal.  Cardiovascular: Normal rate.  Respiratory: Effort normal.  Genitourinary: Cervix exhibits friability. Cervix exhibits no motion tenderness and no discharge.    Vaginal discharge and bleeding present.  There is bleeding in the vagina.    Genitourinary Comments: Speculum Exam: -Normal External Genitalia: Non tender, no apparent discharge at introitus.  -Vaginal Vault: Pink mucosa with good rugae. Scant amt dark brown blood and curdy discharge. Faux swab x1 used for visualization of cervix-wet prep collected -Cervix:Pink, no lesions, cysts, or polyps.  Appears closed. Scant amt bleeding from os-Friable  -Bimanual Exam:  Closed    Musculoskeletal:        General: Normal range of motion.     Cervical back: Normal range of motion.  Neurological: She is alert and oriented to person, place, and time.  Skin: Skin is warm and dry.  Psychiatric: She has a normal mood and affect. Her behavior is normal.    MAU Course  Procedures Results for orders placed or performed during the hospital encounter of 07/29/19 (from the past 24 hour(s))  Urinalysis, Routine w reflex microscopic     Status: Abnormal   Collection Time: 07/29/19  3:06 AM  Result Value Ref Range   Color, Urine YELLOW YELLOW   APPearance CLEAR CLEAR   Specific Gravity, Urine 1.014 1.005 - 1.030   pH 6.0 5.0 - 8.0   Glucose, UA NEGATIVE NEGATIVE mg/dL   Hgb urine dipstick MODERATE (A) NEGATIVE   Bilirubin Urine NEGATIVE NEGATIVE   Ketones, ur NEGATIVE NEGATIVE mg/dL   Protein, ur NEGATIVE NEGATIVE mg/dL   Nitrite NEGATIVE NEGATIVE   Leukocytes,Ua NEGATIVE NEGATIVE   RBC / HPF 0-5 0 - 5 RBC/hpf   WBC, UA 0-5 0 - 5 WBC/hpf   Bacteria, UA RARE (A) NONE SEEN   Squamous Epithelial / LPF 6-10  0 - 5   Mucus PRESENT   Wet prep, genital     Status: Abnormal   Collection Time: 07/29/19  3:40 AM  Result Value Ref Range   Yeast Wet Prep HPF POC PRESENT (A) NONE SEEN   Trich, Wet Prep NONE SEEN NONE SEEN   Clue Cells Wet Prep HPF POC NONE SEEN NONE SEEN   WBC, Wet Prep HPF POC MANY (A) NONE SEEN   Sperm NONE SEEN    US OB Comp Less 14 Wks  Result Date: 07/29/2019 CLINICAL DATA:  Vaginal bleeding  and first-trimester pregnancy EXAM: OBSTETRIC <14 WK ULTRASOUND TECHNIQUE: Transabdominal ultrasound was performed for evaluation of the gestation as well as the maternal uterus and adnexal regions. COMPARISON:  07/12/2019 FINDINGS: Intrauterine gestational sac: Single Yolk sac:  No longer seen Embryo:  Visualized. Cardiac Activity: Visualized. Heart Rate: 166 bpm CRL:   42.7 mm   11 w 1 d                  [place] EDC: 02/16/2020 Subchorionic hemorrhage: Present along the upper sac and measuring 21 x 5 x 25 mm Maternal uterus/adnexae: Unremarkable. IMPRESSION: 1. Single living intrauterine pregnancy measuring 11 weeks 1 day. There has been normal fetal growth since comparison last month. 2. 25 x 21 x 5 mm subchorionic hemorrhage along the upper gestational sac. Electronically Signed   By: Monte Fantasia M.D.   On: 07/29/2019 04:44     MDM Pelvic Exam Labs: Wet prep Ultrasound Assessment and Plan  23 year old, G1P0  SIUP at 11weeks Vaginal Bleeding Known [facility]  -Discussed importance of treating vaginal infections as a means of reducing discomforts and/or pregnancy risks such as preterm labor/birth and IUGR.  -Reviewed POC with patient. -Exam performed and findings discussed.  -Patient offered and declines pain medication.  -Cultures collected and pending.  -Will send for ultrasound and await results.   Fraser Din Kelle Ruppert,MSN, CNM 07/29/2019, 3:24 AM   Reassessment (4:49 AM) Candidiasis of Vagina SIUP at 11 weeks with SCH  -[place] and lab results discussed with patient. -Bleeding precautions  given -Instructed to maintain pelvic rest for 72 hours s/p cessation of bleeding. -Instructed to take Terazol as ordered.  -Instructed keep appt as scheduled. -Encouraged to call primary ob or return to MAU if symptoms worsen or with the onset of new symptoms. -Discharged to home in stable condition.  Maryann Conners MSN, CNM Advanced Practice Provider, Center for [employer]

## 2019-07-30 DIAGNOSIS — Z124 Encounter for screening for malignant neoplasm of cervix: Secondary | ICD-10-CM | POA: Diagnosis not present

## 2019-07-30 DIAGNOSIS — E569 Vitamin deficiency, unspecified: Secondary | ICD-10-CM | POA: Diagnosis not present

## 2019-07-30 DIAGNOSIS — N925 Other specified irregular menstruation: Secondary | ICD-10-CM | POA: Diagnosis not present

## 2019-07-30 DIAGNOSIS — N939 Abnormal uterine and vaginal bleeding, unspecified: Secondary | ICD-10-CM | POA: Diagnosis not present

## 2019-07-30 LAB — OB RESULTS CONSOLE HIV ANTIBODY (ROUTINE TESTING): HIV: NONREACTIVE

## 2019-07-30 LAB — OB RESULTS CONSOLE RPR: RPR: NONREACTIVE

## 2019-07-30 LAB — OB RESULTS CONSOLE RUBELLA ANTIBODY, IGM: Rubella: IMMUNE

## 2019-07-30 LAB — OB RESULTS CONSOLE HEPATITIS B SURFACE ANTIGEN: Hepatitis B Surface Ag: NEGATIVE

## 2019-08-20 NOTE — L&D Delivery Note (Signed)
Delivery Note   Patient Name: Ann Fields DOB: August 24, 1995 MRN: [id number]  Date of admission: 11/28/2019 Delivering MD: [name]  Date of delivery: 12/03/19 Type of delivery: SVD  Newborn Data: Live born female  Birth Weight:   APGAR: ,   Newborn Delivery   Birth date/time: 12/03/2019 03:41:00 Delivery type: Vaginal, Spontaneous    Called to the room pt feeling pressure and desires to push. Cat 2 strip with minimal variable noted. Magnesium d/c @ 0000, x 3 doses of penicillin given and complete. There was a moderate amount of blood noted on towel under pt, BBOW still intact, consulted with Dr Charlesetta Garibaldi, plan to AROM, pt verbalized consent, then to deliver, Pt temp was 99 but pt feeling very warm internally. AROM occurred @ 0320 then 10 mins later pt was complete and feeling urges to push. Dr Charlesetta Garibaldi called and aware delivery was eminent. Dr Charlesetta Garibaldi en route to the hospital. NICU team CALLED AND EN-ROUTE.   [name], 24 y.o., @ [redacted]w[redacted]d,  G1P0000, who was admitted for preterm labor. I was called to the room when she progressed 2+ station in the second stage of labor.  She pushed for 10/min.  She delivered a viable infant, cephalic and restituted to the OA position over an intact perineum.  A nuchal cord   was not identified. The baby was placed on maternal abdomen while initial step of NRP were perfmored (Dry, Stimulated, and warmed). Hat placed on baby for thermoregulation. Delayed cord clamping was performed for 30 seconds.  Cord double clamped and cut.  Cord cut by father. NICU team then in room evaluating NB under radient warmer, NB breathing but requiring CPAP at this time, father of baby and baby and NICU were all transported back to the NICU.  Apgar scores were assigned by NICU, see NICU flow sheet.  Prophylactic Pitocin was started in the third stage of labor for active management. The placenta delivered spontaneously, shultz, with a 3 vessel cord and was sent to LD.  Inspection  revealed none. An examination of the vaginal vault and cervix was free from lacerations. The uterus was firm, bleeding stable.   Placenta was sent to pathology, suspected minor abruptions noted with black clots on placenta, also marginal cord insertion noted. Umbilical artery blood gas were not sent.  There were no complications during the procedure.  Mom and baby skin to skin following delivery. Left in stable condition.  Maternal Info: Anesthesia: None Episiotomy: No Lacerations:  No Suture Repair: NO Est. Blood Loss (mL):  72mls at delivery, prior to delivery 100-200 EBL noted.   Newborn Info:  Baby Sex: female Circumcision: out pt desired APGAR (1 MIN):   APGAR (5 MINS):   APGAR (10 MINS):     Mom to postpartum.  Baby to Couplet care / Skin to Skin.  DR Charlesetta Garibaldi updated on birth.   [place], [place], NP-C 12/03/19 3:55 AM

## 2019-08-31 DIAGNOSIS — Z3A15 15 weeks gestation of pregnancy: Secondary | ICD-10-CM | POA: Diagnosis not present

## 2019-08-31 DIAGNOSIS — Z369 Encounter for antenatal screening, unspecified: Secondary | ICD-10-CM | POA: Diagnosis not present

## 2019-08-31 DIAGNOSIS — Z3492 Encounter for supervision of normal pregnancy, unspecified, second trimester: Secondary | ICD-10-CM | POA: Diagnosis not present

## 2019-09-27 DIAGNOSIS — Z3A19 19 weeks gestation of pregnancy: Secondary | ICD-10-CM | POA: Diagnosis not present

## 2019-09-27 DIAGNOSIS — O99212 Obesity complicating pregnancy, second trimester: Secondary | ICD-10-CM | POA: Diagnosis not present

## 2019-09-27 DIAGNOSIS — Z363 Encounter for antenatal screening for malformations: Secondary | ICD-10-CM | POA: Diagnosis not present

## 2019-09-27 DIAGNOSIS — Z3482 Encounter for supervision of other normal pregnancy, second trimester: Secondary | ICD-10-CM | POA: Diagnosis not present

## 2019-10-27 DIAGNOSIS — Z362 Encounter for other antenatal screening follow-up: Secondary | ICD-10-CM | POA: Diagnosis not present

## 2019-10-27 DIAGNOSIS — Z3A23 23 weeks gestation of pregnancy: Secondary | ICD-10-CM | POA: Diagnosis not present

## 2019-10-28 ENCOUNTER — Other Ambulatory Visit: Payer: Self-pay

## 2019-10-28 ENCOUNTER — Encounter (HOSPITAL_COMMUNITY): Payer: Self-pay | Admitting: Obstetrics and Gynecology

## 2019-10-28 ENCOUNTER — Inpatient Hospital Stay (HOSPITAL_COMMUNITY)
Admission: AD | Admit: 2019-10-28 | Discharge: 2019-11-26 | DRG: 831 | Disposition: A | Discharge status: Home / Self Care | Payer: BC Managed Care – PPO | Attending: Obstetrics & Gynecology | Admitting: Obstetrics & Gynecology

## 2019-10-28 DIAGNOSIS — D649 Anemia, unspecified: Secondary | ICD-10-CM | POA: Diagnosis not present

## 2019-10-28 DIAGNOSIS — A6000 Herpesviral infection of urogenital system, unspecified: Secondary | ICD-10-CM | POA: Diagnosis not present

## 2019-10-28 DIAGNOSIS — Z8759 Personal history of other complications of pregnancy, childbirth and the puerperium: Secondary | ICD-10-CM

## 2019-10-28 DIAGNOSIS — O42912 Preterm premature rupture of membranes, unspecified as to length of time between rupture and onset of labor, second trimester: Secondary | ICD-10-CM | POA: Diagnosis not present

## 2019-10-28 DIAGNOSIS — R58 Hemorrhage, not elsewhere classified: Secondary | ICD-10-CM | POA: Diagnosis not present

## 2019-10-28 DIAGNOSIS — J45909 Unspecified asthma, uncomplicated: Secondary | ICD-10-CM | POA: Diagnosis present

## 2019-10-28 DIAGNOSIS — O4693 Antepartum hemorrhage, unspecified, third trimester: Secondary | ICD-10-CM | POA: Diagnosis not present

## 2019-10-28 DIAGNOSIS — O99820 Streptococcus B carrier state complicating pregnancy: Secondary | ICD-10-CM | POA: Diagnosis present

## 2019-10-28 DIAGNOSIS — O43122 Velamentous insertion of umbilical cord, second trimester: Secondary | ICD-10-CM | POA: Diagnosis present

## 2019-10-28 DIAGNOSIS — Z3A00 Weeks of gestation of pregnancy not specified: Secondary | ICD-10-CM | POA: Diagnosis not present

## 2019-10-28 DIAGNOSIS — O4292 Full-term premature rupture of membranes, unspecified as to length of time between rupture and onset of labor: Secondary | ICD-10-CM | POA: Diagnosis not present

## 2019-10-28 DIAGNOSIS — O98312 Other infections with a predominantly sexual mode of transmission complicating pregnancy, second trimester: Secondary | ICD-10-CM | POA: Diagnosis not present

## 2019-10-28 DIAGNOSIS — O479 False labor, unspecified: Secondary | ICD-10-CM | POA: Diagnosis not present

## 2019-10-28 DIAGNOSIS — Z3A26 26 weeks gestation of pregnancy: Secondary | ICD-10-CM | POA: Diagnosis not present

## 2019-10-28 DIAGNOSIS — O321XX0 Maternal care for breech presentation, not applicable or unspecified: Secondary | ICD-10-CM | POA: Diagnosis not present

## 2019-10-28 DIAGNOSIS — K5900 Constipation, unspecified: Secondary | ICD-10-CM | POA: Diagnosis not present

## 2019-10-28 DIAGNOSIS — O3432 Maternal care for cervical incompetence, second trimester: Principal | ICD-10-CM | POA: Diagnosis present

## 2019-10-28 DIAGNOSIS — O42913 Preterm premature rupture of membranes, unspecified as to length of time between rupture and onset of labor, third trimester: Secondary | ICD-10-CM | POA: Diagnosis not present

## 2019-10-28 DIAGNOSIS — O99012 Anemia complicating pregnancy, second trimester: Secondary | ICD-10-CM | POA: Diagnosis present

## 2019-10-28 DIAGNOSIS — Z20822 Contact with and (suspected) exposure to covid-19: Secondary | ICD-10-CM | POA: Diagnosis not present

## 2019-10-28 DIAGNOSIS — O99512 Diseases of the respiratory system complicating pregnancy, second trimester: Secondary | ICD-10-CM | POA: Diagnosis not present

## 2019-10-28 DIAGNOSIS — E669 Obesity, unspecified: Secondary | ICD-10-CM | POA: Diagnosis not present

## 2019-10-28 DIAGNOSIS — Z3A27 27 weeks gestation of pregnancy: Secondary | ICD-10-CM | POA: Diagnosis not present

## 2019-10-28 DIAGNOSIS — Z3A24 24 weeks gestation of pregnancy: Secondary | ICD-10-CM

## 2019-10-28 DIAGNOSIS — Z3A25 25 weeks gestation of pregnancy: Secondary | ICD-10-CM | POA: Diagnosis not present

## 2019-10-28 DIAGNOSIS — Z113 Encounter for screening for infections with a predominantly sexual mode of transmission: Secondary | ICD-10-CM | POA: Diagnosis not present

## 2019-10-28 DIAGNOSIS — O3433 Maternal care for cervical incompetence, third trimester: Secondary | ICD-10-CM | POA: Diagnosis not present

## 2019-10-28 DIAGNOSIS — O26892 Other specified pregnancy related conditions, second trimester: Secondary | ICD-10-CM | POA: Diagnosis present

## 2019-10-28 DIAGNOSIS — Z362 Encounter for other antenatal screening follow-up: Secondary | ICD-10-CM | POA: Diagnosis not present

## 2019-10-28 DIAGNOSIS — O42919 Preterm premature rupture of membranes, unspecified as to length of time between rupture and onset of labor, unspecified trimester: Secondary | ICD-10-CM

## 2019-10-28 DIAGNOSIS — O43192 Other malformation of placenta, second trimester: Secondary | ICD-10-CM | POA: Diagnosis not present

## 2019-10-28 DIAGNOSIS — O99213 Obesity complicating pregnancy, third trimester: Secondary | ICD-10-CM | POA: Diagnosis not present

## 2019-10-28 DIAGNOSIS — O42113 Preterm premature rupture of membranes, onset of labor more than 24 hours following rupture, third trimester: Secondary | ICD-10-CM | POA: Diagnosis not present

## 2019-10-28 DIAGNOSIS — Z3A28 28 weeks gestation of pregnancy: Secondary | ICD-10-CM | POA: Diagnosis not present

## 2019-10-28 DIAGNOSIS — O411290 Chorioamnionitis, unspecified trimester, not applicable or unspecified: Secondary | ICD-10-CM | POA: Diagnosis not present

## 2019-10-28 DIAGNOSIS — O4290 Premature rupture of membranes, unspecified as to length of time between rupture and onset of labor, unspecified weeks of gestation: Secondary | ICD-10-CM

## 2019-10-28 DIAGNOSIS — R5381 Other malaise: Secondary | ICD-10-CM | POA: Diagnosis not present

## 2019-10-28 DIAGNOSIS — Z3492 Encounter for supervision of normal pregnancy, unspecified, second trimester: Secondary | ICD-10-CM | POA: Diagnosis not present

## 2019-10-28 DIAGNOSIS — O42112 Preterm premature rupture of membranes, onset of labor more than 24 hours following rupture, second trimester: Secondary | ICD-10-CM | POA: Diagnosis not present

## 2019-10-28 DIAGNOSIS — O99212 Obesity complicating pregnancy, second trimester: Secondary | ICD-10-CM | POA: Diagnosis not present

## 2019-10-28 DIAGNOSIS — O26899 Other specified pregnancy related conditions, unspecified trimester: Secondary | ICD-10-CM | POA: Diagnosis not present

## 2019-10-28 LAB — COMPREHENSIVE METABOLIC PANEL
ALT: 11 U/L (ref 0–44)
AST: 16 U/L (ref 15–41)
Albumin: 2.6 g/dL — ABNORMAL LOW (ref 3.5–5.0)
Alkaline Phosphatase: 73 U/L (ref 38–126)
Anion gap: 9 (ref 5–15)
BUN: <5 mg/dL — ABNORMAL LOW (ref 6–20)
CO2: 21 mmol/L — ABNORMAL LOW (ref 22–32)
Calcium: 8.8 mg/dL — ABNORMAL LOW (ref 8.9–10.3)
Chloride: 108 mmol/L (ref 98–111)
Creatinine, Ser: 0.53 mg/dL (ref 0.44–1.00)
GFR calc Af Amer: >60 mL/min (ref >60)
GFR calc non Af Amer: >60 mL/min (ref >60)
Glucose, Bld: 100 mg/dL — ABNORMAL HIGH (ref 70–99)
Potassium: 4.3 mmol/L (ref 3.5–5.1)
Sodium: 138 mmol/L (ref 135–145)
Total Bilirubin: 0.4 mg/dL (ref 0.3–1.2)
Total Protein: 6.1 g/dL — ABNORMAL LOW (ref 6.5–8.1)

## 2019-10-28 LAB — URINALYSIS, ROUTINE W REFLEX MICROSCOPIC
Bilirubin Urine: NEGATIVE
Glucose, UA: NEGATIVE mg/dL
Hgb urine dipstick: NEGATIVE
Ketones, ur: NEGATIVE mg/dL
Nitrite: NEGATIVE
Protein, ur: NEGATIVE mg/dL
Specific Gravity, Urine: 1.002 — ABNORMAL LOW (ref 1.005–1.030)
pH: 6 (ref 5.0–8.0)

## 2019-10-28 LAB — CBC WITH DIFFERENTIAL/PLATELET
Abs Immature Granulocytes: 0.21 10*3/uL — ABNORMAL HIGH (ref 0.00–0.07)
Basophils Absolute: 0 10*3/uL (ref 0.0–0.1)
Basophils Relative: 0 %
Eosinophils Absolute: 0 10*3/uL (ref 0.0–0.5)
Eosinophils Relative: 0 %
HCT: 33.7 % — ABNORMAL LOW (ref 36.0–46.0)
Hemoglobin: 10.8 g/dL — ABNORMAL LOW (ref 12.0–15.0)
Immature Granulocytes: 2 %
Lymphocytes Relative: 9 %
Lymphs Abs: 1.2 10*3/uL (ref 0.7–4.0)
MCH: 27.3 pg (ref 26.0–34.0)
MCHC: 32 g/dL (ref 30.0–36.0)
MCV: 85.3 fL (ref 80.0–100.0)
Monocytes Absolute: 0.3 10*3/uL (ref 0.1–1.0)
Monocytes Relative: 3 %
Neutro Abs: 11.9 10*3/uL — ABNORMAL HIGH (ref 1.7–7.7)
Neutrophils Relative %: 86 %
Platelets: 268 10*3/uL (ref 150–400)
RBC: 3.95 MIL/uL (ref 3.87–5.11)
RDW: 13.1 % (ref 11.5–15.5)
WBC: 13.7 10*3/uL — ABNORMAL HIGH (ref 4.0–10.5)
nRBC: 0 % (ref 0.0–0.2)

## 2019-10-28 LAB — WET PREP, GENITAL
Clue Cells Wet Prep HPF POC: NONE SEEN
Sperm: NONE SEEN
Trich, Wet Prep: NONE SEEN
Yeast Wet Prep HPF POC: NONE SEEN

## 2019-10-28 LAB — TYPE AND SCREEN
ABO/RH(D): A POS
Antibody Screen: NEGATIVE

## 2019-10-28 LAB — SARS CORONAVIRUS 2 (TAT 6-24 HRS): SARS Coronavirus 2: NEGATIVE

## 2019-10-28 LAB — AMNISURE RUPTURE OF MEMBRANE (ROM) NOT AT ARMC: Amnisure ROM: POSITIVE

## 2019-10-28 MED ORDER — BETAMETHASONE SOD PHOS & ACET 6 (3-3) MG/ML IJ SUSP
12.0000 mg | INTRAMUSCULAR | Status: AC
  Administered 2019-10-28 – 2019-10-29 (×2): 12 mg via INTRAMUSCULAR
  Filled 2019-10-28: qty 5

## 2019-10-28 MED ORDER — SODIUM CHLORIDE 0.9 % IV SOLN
2.0000 g | Freq: Four times a day (QID) | INTRAVENOUS | Status: AC
  Administered 2019-10-28 – 2019-10-30 (×8): 2 g via INTRAVENOUS
  Filled 2019-10-28 (×8): qty 2000

## 2019-10-28 MED ORDER — MAGNESIUM SULFATE 40 GM/1000ML IV SOLN
2.0000 g/h | INTRAVENOUS | Status: AC
  Administered 2019-10-28: 2 g/h via INTRAVENOUS
  Filled 2019-10-28: qty 1000

## 2019-10-28 MED ORDER — AZITHROMYCIN 250 MG PO TABS
1000.0000 mg | ORAL_TABLET | Freq: Once | ORAL | Status: AC
  Administered 2019-10-28: 1000 mg via ORAL
  Filled 2019-10-28: qty 4

## 2019-10-28 MED ORDER — AZITHROMYCIN 1 G PO PACK: 1.0000 g | PACK | Freq: Once | ORAL | Status: DC

## 2019-10-28 MED ORDER — AMOXICILLIN 500 MG PO CAPS
500.0000 mg | ORAL_CAPSULE | Freq: Three times a day (TID) | ORAL | Status: AC
  Administered 2019-10-30 – 2019-11-04 (×15): 500 mg via ORAL
  Filled 2019-10-28 (×15): qty 1

## 2019-10-28 MED ORDER — MAGNESIUM SULFATE BOLUS VIA INFUSION
4.0000 g | Freq: Once | INTRAVENOUS | Status: AC
  Administered 2019-10-28: 23:00:00 4 g via INTRAVENOUS
  Filled 2019-10-28: qty 1000

## 2019-10-28 MED ORDER — AZITHROMYCIN 250 MG PO TABS: 1000.0000 mg | ORAL_TABLET | Freq: Every day | ORAL | Status: DC

## 2019-10-28 MED ORDER — LACTATED RINGERS IV SOLN: INTRAVENOUS | Status: DC

## 2019-10-28 NOTE — H&P (Signed)
Ann Fields is a 24 y.o. female presenting for advanced dilation of 3cm.  Pt reports passing mucous on 2/28 and reported to Dr. Charlesetta Garibaldi today in office a copious amount of discharge.  GBS/GC/CT sent by Dr. Charlesetta Garibaldi from office.  Pt reported to nurse feeling pressure so I performed a speculum exam and exam unchanged.  No digital eval.  Wet prep and fern test done.  Fern returned borderline and amnisure collected and returned positive.  Pt denied any bleeding, ctxs/cramping and reports +FM. Marland Kitchen OB History    Gravida  1   Para  0   Term  0   Preterm  0   AB  0   Living  0     SAB  0   TAB  0   Ectopic  0   Multiple  0   Live Births  0          Past Medical History:  Diagnosis Date  . Endometriosis   . History of seizure    12-30-2017  per pt age 19---  x3 seizure's--  unknown cause after work-up done-- no seizure since  . Leiomyoma of uterus   . Menorrhagia   . Migraines   . Mild asthma    Past Surgical History:  Procedure Laterality Date  . DILATATION & CURETTAGE/HYSTEROSCOPY WITH MYOSURE N/A 01/02/2018   Procedure: DILATATION & CURETTAGE/HYSTEROSCOPY WITH MYOSURE;  Surgeon: Anastasio Auerbach, MD;  Location: [facility];  Service: Gynecology;  Laterality: N/A;  request to follow in [facility] g[facility] block time requests one hour  . HYSTEROSCOPY W/ POLYPECTOMY  02/2017  . WISDOM TOOTH EXTRACTION  age 78   Family History: family history includes Cancer in her maternal grandfather and maternal grandmother; Depression in her sister; Diabetes in her paternal grandmother; Heart disease in her paternal grandfather; Hypertension in her paternal grandmother. Social History:  reports that she has never smoked. She has never used smokeless tobacco. She reports previous alcohol use. She reports that she does not use drugs.     Maternal Diabetes: to be determined Genetic Screening: Normal Maternal Ultrasounds/Referrals: Other:marginal cord insertion otherwise no  anomalies Fetal Ultrasounds or other Referrals:  None Maternal Substance Abuse:  No Significant Maternal Medications:  Meds include: Other: fioricet for HAs Significant Maternal Lab Results:  Other: GBS pending from office as well as GC/CT Other Comments:  None  Review of Systems History  denies F/C/N/V/D Blood pressure 136/65, pulse (!) 121, temperature 98.7 F (37.1 C), temperature source Oral, resp. rate 17, height 5\' 5"  (1.651 m), weight 104.3 kg, last menstrual period 05/04/2019, SpO2 98 %. Exam Physical Exam  Lungs CTA  CV RRR Abd gravid, NT Ext no calf tenderness Spec appears 3cm, minimal fluid in vagina Bedside U/S breech Prenatal labs: ABO, Rh: --/--/A POS (03/11 1359) Antibody: NEG (03/11 1359) Rubella:  Immune RPR:   NR HBsAg:   Neg HIV:  NR  GBS:   pending from office  Assessment/Plan: P0 at 24wks admitted with PPROM started on latency antibiotics (zithromax and amp/amox) Tracing is cat 1 and no contractions on monitor or with palpation UA, Cx pending in hosp GBS/GC/CT pending from office Wet prep done, no BV, yeast or trich  Strict bedrest MFM consult Neonatology consult Continuous monitoring for now Delice Lesch 10/28/2019, 6:51 PM

## 2019-10-28 NOTE — Progress Notes (Signed)
S:  Having some occ pressure. Had a BM today.   O:  FHT 150 accels noted no decels No contractions on monitor A:G1P0 at 24 weeks with PPROM clear fluid  Cat 1 strip P: Magnesium Sulfate for neuroprotection. Discussed risk and benefits with pt and partner. Dr. Nelda Marseille aware

## 2019-10-29 ENCOUNTER — Inpatient Hospital Stay (HOSPITAL_COMMUNITY): Payer: BC Managed Care – PPO

## 2019-10-29 ENCOUNTER — Ambulatory Visit (HOSPITAL_COMMUNITY): Payer: BC Managed Care – PPO

## 2019-10-29 ENCOUNTER — Inpatient Hospital Stay (HOSPITAL_BASED_OUTPATIENT_CLINIC_OR_DEPARTMENT_OTHER): Payer: BC Managed Care – PPO

## 2019-10-29 DIAGNOSIS — O3432 Maternal care for cervical incompetence, second trimester: Secondary | ICD-10-CM | POA: Diagnosis not present

## 2019-10-29 DIAGNOSIS — Z3A27 27 weeks gestation of pregnancy: Secondary | ICD-10-CM | POA: Diagnosis not present

## 2019-10-29 DIAGNOSIS — Z3A25 25 weeks gestation of pregnancy: Secondary | ICD-10-CM | POA: Diagnosis not present

## 2019-10-29 DIAGNOSIS — O3433 Maternal care for cervical incompetence, third trimester: Secondary | ICD-10-CM | POA: Diagnosis not present

## 2019-10-29 DIAGNOSIS — Z3A28 28 weeks gestation of pregnancy: Secondary | ICD-10-CM | POA: Diagnosis not present

## 2019-10-29 DIAGNOSIS — O42112 Preterm premature rupture of membranes, onset of labor more than 24 hours following rupture, second trimester: Secondary | ICD-10-CM | POA: Diagnosis not present

## 2019-10-29 DIAGNOSIS — O42113 Preterm premature rupture of membranes, onset of labor more than 24 hours following rupture, third trimester: Secondary | ICD-10-CM | POA: Diagnosis not present

## 2019-10-29 DIAGNOSIS — O4292 Full-term premature rupture of membranes, unspecified as to length of time between rupture and onset of labor: Secondary | ICD-10-CM | POA: Diagnosis not present

## 2019-10-29 DIAGNOSIS — Z3A24 24 weeks gestation of pregnancy: Secondary | ICD-10-CM

## 2019-10-29 LAB — CULTURE, OB URINE

## 2019-10-29 MED ORDER — ALBUTEROL SULFATE (2.5 MG/3ML) 0.083% IN NEBU: 3.0000 mL | INHALATION_SOLUTION | RESPIRATORY_TRACT | Status: DC | PRN

## 2019-10-29 NOTE — Consult Note (Signed)
[facility]  Consultation Service: Neonatology   Dr. Mancel Bale has asked for consultation on Ms. Gittleman regarding potential delivery and care for a premature infant at [redacted] weeks gestation. Thank you for inviting [place] to see this patient.   Reason for consult:  Explain the possible complications, the prognosis, and the care of a premature infant at [redacted] weeks gestation.   Chief complaint: 24 year old G9P0 female with a singleton IUP at 40 weeks' gestation with PPROM and cervical dilation. At the time of consultation, the estimated fetal weight is unknown.   My key findings of this patient's HPI are:  I have reviewed the patient's chart and have met with her. The salient information is as follows:   24 weeks' gestation, they are having a boy who they have named Aiden. PPROM earlier today. She has stable cervical dilation at 3 cm and does not appear to be actively laboring at this time. She is receiving latency antibiotics. She has received her first dose of betamethasone and has been started on neuroprotective magnesium. Baby was breech on bedside ultrasound today. EFW unknown. History of marginal cord insertion with normal anatomy according to OB H&P.  Prenatal labs:  Blood type A POS, antibody screen negative Rubella:  Immune RPR:   NR HBsAg:   Neg HIV:  NR  GBS:   pending from office GC/CT: pending   Prenatal care:   good Pregnancy complications:  PPROM and cervical dilation Maternal antibiotics: Amoxicillin, azithromycin Maternal Steroids: First dose given 10/28/2019 @ 1109 AM   My recommendations for this patient and my actions included:   In the presence of both parents, I spent 25 minutes discussing the possible complications and outcomes of prematurity at this gestational age. I discussed survival rate at this gestational age as well as the complications related to prematurity that could arise. I informed them that the NICU team would be present at the  delivery. Parents agreed that all appropriate medical measures could be taken to resuscitate their infant at the delivery.   I discussed the potential need for resuscitation at birth, mechanical ventilation and surfactant administration for respiratory distress, umbilical line placement, IV fluids pending establishment of enteral feeds (encouraged breast milk feeding to which she plans to do), antibiotics for possible sepsis, temperature support, and monitoring. I also discussed the potential risk of complications such as intracranial hemorrhage, retinopathy, and chronic lung disease. We discussed the potential length of stay in the neonatal intensive care unit, until around the time of baby's due date, understanding that the course could be longer or shorter. We discussed the risk of long-term developmental issues and the resources to assist in optimizing his development. Parents expressed an understanding of the risks and complications of prematurity and they expressed that Aiden's survival is their priority. We discussed that depending on Aiden's condition and NICU course, some difficult decisions may have to be made; parents expressed understanding.  We talked about the visitation policy in the NICU.   Ms. Hickmon asked about the best mode of delivery for her baby and we briefly discussed the factors that would impact that decision. I encouraged her to speak with her OB providers if she would like to discuss this further.  Final Impression:  Ms. Prest is a 24 year old G55P0 female with a female singleton pregnancy who is threatening to deliver related to PPROM at [redacted] weeks gestation. She and her partner now understand the possible complications and prognosis of their infant. They expressed that Aiden's survival  is most important to them and they wish to proceed with a trial of intensive care.  ______________________________________________________________________  Thank you for asking [place] to participate  in the care of this patient. Please do not hesitate to contact us again if you are aware of any further ways we can be of assistance.   Renato Shin, MD Neonatologist  I spent ~40 minutes in consultation time, of which 25 minutes was spent in direct face to face counseling.

## 2019-10-29 NOTE — Progress Notes (Signed)
Spoke with [name], CNM regarding an end time for the magnesium. Received verbal order to d/c magnesium at 11 am if the MFM doctor has not rounded on her by then.

## 2019-10-29 NOTE — Progress Notes (Signed)
Girtie is a 24 year old G1P0 at 24.1 weeks Admitted 3/11with advanced dilation and PPROM of unknown date S/P BMZ x1, 2nd dose due at 1100 today NICU consult complete MFM consult pending MgSO4 for neuroprotection Latency abx  Marginal cord insertion Breech presentation 3 cm previously Wet prep WNL UA unremarkable  Subjective: Resting w FOB at bedside Denies contractions, vb, or pain Reports adequate fetal movement  Objective: BP (!) 103/44   Pulse 90   Temp 98.3 F (36.8 C) (Oral)   Resp 16   Ht 5\' 5"  (1.651 m)   Wt 104.3 kg   LMP 05/04/2019   SpO2 98%   BMI 38.27 kg/m  I/O last 3 completed shifts: In: 2325.7 [P.O.:435; I.V.:1690.7; IV Piggyback:200] Out: 900 [Urine:900] No intake/output data recorded.  FHT:  FHR: 140 bpm, variability: moderate,  accelerations:  Abscent,  decelerations:  Absent Cat I tracing UC:   none SVE:     Deferred Labs: Lab Results  Component Value Date   WBC 13.7 (H) 10/28/2019   HGB 10.8 (L) 10/28/2019   HCT 33.7 (L) 10/28/2019   MCV 85.3 10/28/2019   PLT 268 10/28/2019    Assessment / Plan: PPROM of unknown date  UCS pending in hosp GBS/GC/CT pending from office Strict bedrest MFM consult Continuous monitoring for now   [place] B Chrystal Zeimet 10/29/2019, 7:59 AM

## 2019-10-29 NOTE — Progress Notes (Signed)
Pt without complaints.  No leakage of fluid or VB.  Good FM  BP (!) 111/50 (BP Location: Right Arm)   Pulse 91   Temp 98.4 F (36.9 C) (Oral)   Resp 16   Ht 5\' 5"  (1.651 m)   Wt 104.3 kg   LMP 05/04/2019   SpO2 99%   BMI 38.27 kg/m   FHTS Baseline: 140 bpm  Toco none  Pt in NAD CV RRR Lungs CTAB abd  Gravid soft and NT GU no vb EXt no calf tenderness Results for orders placed or performed during the hospital encounter of 10/28/19 (from the past 72 hour(s))  OB Urine Culture     Status: Abnormal   Collection Time: 10/28/19 10:52 AM   Specimen: OB Clean Catch; Urine  Result Value Ref Range   Specimen Description OB CLEAN CATCH    Special Requests NONE    Culture (A)     MULTIPLE SPECIES PRESENT, SUGGEST RECOLLECTION NO GROUP B STREP (S.AGALACTIAE) ISOLATED Performed at [facility], [address] [place], [place] [address]    Report Status 10/29/2019 FINAL   SARS CORONAVIRUS 2 (TAT 6-24 HRS) Nasopharyngeal Nasopharyngeal Swab     Status: None   Collection Time: 10/28/19 11:17 AM   Specimen: Nasopharyngeal Swab  Result Value Ref Range   SARS Coronavirus 2 NEGATIVE NEGATIVE    Comment: (NOTE) SARS-CoV-2 target nucleic acids are NOT DETECTED. The SARS-CoV-2 RNA is generally detectable in upper and lower respiratory specimens during the acute phase of infection. Negative results do not preclude SARS-CoV-2 infection, do not rule out co-infections with other pathogens, and should not be used as the sole basis for treatment or other patient management decisions. Negative results must be combined with clinical observations, patient history, and epidemiological information. The expected result is Negative. Fact Sheet for Patients: SugarRoll.be Fact Sheet for Healthcare Providers: https://www.woods-mathews.com/ This test is not yet approved or cleared by the [place] FDA and  has been authorized for detection and/or  diagnosis of SARS-CoV-2 by FDA under an Emergency Use Authorization (EUA). This EUA will remain  in effect (meaning this test can be used) for the duration of the COVID-19 declaration under Section 56 4(b)(1) of the Act, 21 U.S.C. section 360bbb-3(b)(1), unless the authorization is terminated or revoked sooner. Performed at [facility], [place] [address] [place], [place] [address]   Urinalysis, Routine w reflex microscopic     Status: Abnormal   Collection Time: 10/28/19 11:20 AM  Result Value Ref Range   Color, Urine COLORLESS (A) YELLOW   APPearance CLEAR CLEAR   Specific Gravity, Urine 1.002 (L) 1.005 - 1.030   pH 6.0 5.0 - 8.0   Glucose, UA NEGATIVE NEGATIVE mg/dL   Hgb urine dipstick NEGATIVE NEGATIVE   Bilirubin Urine NEGATIVE NEGATIVE   Ketones, ur NEGATIVE NEGATIVE mg/dL   Protein, ur NEGATIVE NEGATIVE mg/dL   Nitrite NEGATIVE NEGATIVE   Leukocytes,Ua TRACE (A) NEGATIVE   RBC / HPF 0-5 0 - 5 RBC/hpf   WBC, UA 0-5 0 - 5 WBC/hpf   Bacteria, UA RARE (A) NONE SEEN   Squamous Epithelial / LPF 0-5 0 - 5    Comment: Performed at [facility], [address] [place], [place] [address]  Wet prep, genital     Status: Abnormal   Collection Time: 10/28/19  1:00 PM   Specimen: Vaginal  Result Value Ref Range   Yeast Wet Prep HPF POC NONE SEEN NONE SEEN  Trich, Wet Prep NONE SEEN NONE SEEN   Clue Cells Wet Prep HPF POC NONE SEEN NONE SEEN   WBC, Wet Prep HPF POC MANY (A) NONE SEEN   Sperm NONE SEEN     Comment: Performed at [facility], [place] [address], [place], [place] [address]  Amnisure rupture of membrane (rom)not at [facility]     Status: None   Collection Time: 10/28/19  1:19 PM  Result Value Ref Range   Amnisure ROM POSITIVE     Comment: Performed at [facility], [address], [place], [place] [address]  CBC with Differential/Platelet     Status: Abnormal   Collection Time: 10/28/19  1:59 PM  Result Value Ref Range   WBC 13.7 (H) 4.0 - 10.5 K/uL    RBC 3.95 3.87 - 5.11 MIL/uL   Hemoglobin 10.8 (L) 12.0 - 15.0 g/dL   HCT 33.7 (L) 36.0 - 46.0 %   MCV 85.3 80.0 - 100.0 fL   MCH 27.3 26.0 - 34.0 pg   MCHC 32.0 30.0 - 36.0 g/dL   RDW 13.1 11.5 - 15.5 %   Platelets 268 150 - 400 K/uL   nRBC 0.0 0.0 - 0.2 %   Neutrophils Relative % 86 %   Neutro Abs 11.9 (H) 1.7 - 7.7 K/uL   Lymphocytes Relative 9 %   Lymphs Abs 1.2 0.7 - 4.0 K/uL   Monocytes Relative 3 %   Monocytes Absolute 0.3 0.1 - 1.0 K/uL   Eosinophils Relative 0 %   Eosinophils Absolute 0.0 0.0 - 0.5 K/uL   Basophils Relative 0 %   Basophils Absolute 0.0 0.0 - 0.1 K/uL   Immature Granulocytes 2 %   Abs Immature Granulocytes 0.21 (H) 0.00 - 0.07 K/uL    Comment: Performed at [facility] [address] [place], [place] [address]  Comprehensive metabolic panel     Status: Abnormal   Collection Time: 10/28/19  1:59 PM  Result Value Ref Range   Sodium 138 135 - 145 mmol/L   Potassium 4.3 3.5 - 5.1 mmol/L   Chloride 108 98 - 111 mmol/L   CO2 21 (L) 22 - 32 mmol/L   Glucose, Bld 100 (H) 70 - 99 mg/dL    Comment: Glucose reference range applies only to samples taken after fasting for at least 8 hours.   BUN <5 (L) 6 - 20 mg/dL   Creatinine, Ser 0.53 0.44 - 1.00 mg/dL   Calcium 8.8 (L) 8.9 - 10.3 mg/dL   Total Protein 6.1 (L) 6.5 - 8.1 g/dL   Albumin 2.6 (L) 3.5 - 5.0 g/dL   AST 16 15 - 41 U/L   ALT 11 0 - 44 U/L   Alkaline Phosphatase 73 38 - 126 U/L   Total Bilirubin 0.4 0.3 - 1.2 mg/dL   GFR calc non Af Amer >60 >60 mL/min   GFR calc Af Amer >60 >60 mL/min   Anion gap 9 5 - 15    Comment: Performed at [facility] [address] [place], [place] [address]  Type and screen [place]     Status: None   Collection Time: 10/28/19  1:59 PM  Result Value Ref Range   ABO/RH(D) A POS    Antibody Screen NEG    Sample Expiration      10/31/2019,2359 Performed at [facility], [place] [address] [place], [place] [address]      Assessment and Plan  [redacted]w[redacted]d PPROM S/p magnesium for neuro protection Pt stable and monitoring reassuring Labs pending MFM consult pending Pt understands if delivery is needed and baby is breech will need a CS and possible classical cs  Patient ID: Ann Fields, female   DOB: July 01, 1996, 24 y.o.   MRN: [id number]

## 2019-10-30 ENCOUNTER — Encounter (HOSPITAL_COMMUNITY): Payer: Self-pay | Admitting: Obstetrics and Gynecology

## 2019-10-30 MED ORDER — VALACYCLOVIR HCL 500 MG PO TABS
1000.0000 mg | ORAL_TABLET | Freq: Every day | ORAL | Status: DC
  Administered 2019-10-30 – 2019-11-26 (×28): 1000 mg via ORAL
  Filled 2019-10-30 (×29): qty 2

## 2019-10-30 MED ORDER — ALBUTEROL SULFATE (2.5 MG/3ML) 0.083% IN NEBU
3.0000 mL | INHALATION_SOLUTION | RESPIRATORY_TRACT | Status: DC | PRN
  Administered 2019-10-30: 3 mL via RESPIRATORY_TRACT
  Filled 2019-10-30: qty 3

## 2019-10-30 NOTE — Progress Notes (Signed)
Pt without complaints.  No leakage of fluid or VB.  Good FM.  She does have a little more discharge  BP 132/66 (BP Location: Right Arm)   Pulse 88   Temp 98.7 F (37.1 C) (Oral)   Resp 16   Ht 5\' 5"  (1.651 m)   Wt 104.3 kg   LMP 05/04/2019   SpO2 99%   BMI 38.27 kg/m   FHTS Baseline: 140 bpm accels and occ variable  Toco none  Pt in NAD CV RRR Lungs CTAB abd  Gravid soft and NT GU no vb EXt no calf tenderness Results for orders placed or performed during the hospital encounter of 10/28/19 (from the past 72 hour(s))  OB Urine Culture     Status: Abnormal   Collection Time: 10/28/19 10:52 AM   Specimen: OB Clean Catch; Urine  Result Value Ref Range   Specimen Description OB CLEAN CATCH    Special Requests NONE    Culture (A)     MULTIPLE SPECIES PRESENT, SUGGEST RECOLLECTION NO GROUP B STREP (S.AGALACTIAE) ISOLATED Performed at [facility], [address], [place], [place] [address]    Report Status 10/29/2019 FINAL   SARS CORONAVIRUS 2 (TAT 6-24 HRS) Nasopharyngeal Nasopharyngeal Swab     Status: None   Collection Time: 10/28/19 11:17 AM   Specimen: Nasopharyngeal Swab  Result Value Ref Range   SARS Coronavirus 2 NEGATIVE NEGATIVE    Comment: (NOTE) SARS-CoV-2 target nucleic acids are NOT DETECTED. The SARS-CoV-2 RNA is generally detectable in upper and lower respiratory specimens during the acute phase of infection. Negative results do not preclude SARS-CoV-2 infection, do not rule out co-infections with other pathogens, and should not be used as the sole basis for treatment or other patient management decisions. Negative results must be combined with clinical observations, patient history, and epidemiological information. The expected result is Negative. Fact Sheet for Patients: SugarRoll.be Fact Sheet for Healthcare Providers: https://www.woods-mathews.com/ This test is not yet approved or cleared by the [place] FDA and  has been authorized for detection and/or diagnosis of SARS-CoV-2 by FDA under an Emergency Use Authorization (EUA). This EUA will remain  in effect (meaning this test can be used) for the duration of the COVID-19 declaration under Section 56 4(b)(1) of the Act, 21 U.S.C. section 360bbb-3(b)(1), unless the authorization is terminated or revoked sooner. Performed at [facility], [place] [address] [place], [place] [address]   Urinalysis, Routine w reflex microscopic     Status: Abnormal   Collection Time: 10/28/19 11:20 AM  Result Value Ref Range   Color, Urine COLORLESS (A) YELLOW   APPearance CLEAR CLEAR   Specific Gravity, Urine 1.002 (L) 1.005 - 1.030   pH 6.0 5.0 - 8.0   Glucose, UA NEGATIVE NEGATIVE mg/dL   Hgb urine dipstick NEGATIVE NEGATIVE   Bilirubin Urine NEGATIVE NEGATIVE   Ketones, ur NEGATIVE NEGATIVE mg/dL   Protein, ur NEGATIVE NEGATIVE mg/dL   Nitrite NEGATIVE NEGATIVE   Leukocytes,Ua TRACE (A) NEGATIVE   RBC / HPF 0-5 0 - 5 RBC/hpf   WBC, UA 0-5 0 - 5 WBC/hpf   Bacteria, UA RARE (A) NONE SEEN   Squamous Epithelial / LPF 0-5 0 - 5    Comment: Performed at [facility], [address] [place], [place] [address]  Wet prep, genital     Status: Abnormal   Collection Time: 10/28/19  1:00 PM   Specimen: Vaginal  Result Value Ref Range  Yeast Wet Prep HPF POC NONE SEEN NONE SEEN   Trich, Wet Prep NONE SEEN NONE SEEN   Clue Cells Wet Prep HPF POC NONE SEEN NONE SEEN   WBC, Wet Prep HPF POC MANY (A) NONE SEEN   Sperm NONE SEEN     Comment: Performed at [facility], [place] [address], [place], [place] [address]  Amnisure rupture of membrane (rom)not at [facility]     Status: None   Collection Time: 10/28/19  1:19 PM  Result Value Ref Range   Amnisure ROM POSITIVE     Comment: Performed at [facility], [address] [place], [place] [address]  CBC with Differential/Platelet     Status: Abnormal   Collection Time: 10/28/19  1:59 PM   Result Value Ref Range   WBC 13.7 (H) 4.0 - 10.5 K/uL   RBC 3.95 3.87 - 5.11 MIL/uL   Hemoglobin 10.8 (L) 12.0 - 15.0 g/dL   HCT 33.7 (L) 36.0 - 46.0 %   MCV 85.3 80.0 - 100.0 fL   MCH 27.3 26.0 - 34.0 pg   MCHC 32.0 30.0 - 36.0 g/dL   RDW 13.1 11.5 - 15.5 %   Platelets 268 150 - 400 K/uL   nRBC 0.0 0.0 - 0.2 %   Neutrophils Relative % 86 %   Neutro Abs 11.9 (H) 1.7 - 7.7 K/uL   Lymphocytes Relative 9 %   Lymphs Abs 1.2 0.7 - 4.0 K/uL   Monocytes Relative 3 %   Monocytes Absolute 0.3 0.1 - 1.0 K/uL   Eosinophils Relative 0 %   Eosinophils Absolute 0.0 0.0 - 0.5 K/uL   Basophils Relative 0 %   Basophils Absolute 0.0 0.0 - 0.1 K/uL   Immature Granulocytes 2 %   Abs Immature Granulocytes 0.21 (H) 0.00 - 0.07 K/uL    Comment: Performed at [facility] [address] [place], [place] [address]  Comprehensive metabolic panel     Status: Abnormal   Collection Time: 10/28/19  1:59 PM  Result Value Ref Range   Sodium 138 135 - 145 mmol/L   Potassium 4.3 3.5 - 5.1 mmol/L   Chloride 108 98 - 111 mmol/L   CO2 21 (L) 22 - 32 mmol/L   Glucose, Bld 100 (H) 70 - 99 mg/dL    Comment: Glucose reference range applies only to samples taken after fasting for at least 8 hours.   BUN <5 (L) 6 - 20 mg/dL   Creatinine, Ser 0.53 0.44 - 1.00 mg/dL   Calcium 8.8 (L) 8.9 - 10.3 mg/dL   Total Protein 6.1 (L) 6.5 - 8.1 g/dL   Albumin 2.6 (L) 3.5 - 5.0 g/dL   AST 16 15 - 41 U/L   ALT 11 0 - 44 U/L   Alkaline Phosphatase 73 38 - 126 U/L   Total Bilirubin 0.4 0.3 - 1.2 mg/dL   GFR calc non Af Amer >60 >60 mL/min   GFR calc Af Amer >60 >60 mL/min   Anion gap 9 5 - 15    Comment: Performed at [facility] [address], [place], [place] [address]  Type and screen [place]     Status: None   Collection Time: 10/28/19  1:59 PM  Result Value Ref Range   ABO/RH(D) A POS    Antibody Screen NEG    Sample Expiration      10/31/2019,2359 Performed at [facility], [place]  [address], [place], [place] [address]     Assessment and Plan [redacted]w[redacted]d PPROM APPRECIATE NICU AND MFM CONSULTS Continue antibiotics Fluid check q week Mag for neuroprophylaxsis if labor starts 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   MRN: [id number]

## 2019-10-30 NOTE — Consult Note (Addendum)
MFM Note  Ann Fields was seen in consultation due to PPROM and advanced cervical dilatation.  She is currently at 24 weeks 1 day.  This is her first pregnancy.  The patient reports that she has experienced a watery vaginal discharge for up to the past 2 weeks.  During an office visit yesterday, her cervix was noted to be 3 cm dilated.  A rupture of membrane check indicated probable rupture of membranes.  The fern test was borderline.  However, she did have a positive amnisure test.    Due to PPROM and her advanced cervical dilatation, she was admitted to the hospital and placed on magnesium sulfate for 12 hours for fetal neuro protection.  She was also started on latency antibiotics.  Currently, the patient denies feeling any leakage of fluid.  Her fetal status has been reassuring.  She denies feeling any contractions.  The patient's past medical history includes a history of asthma, endometriosis, and history of seizures of unknown origin.    Her past surgical history includes a D&C and hysteroscopy with polypectomy.  The patient had an ultrasound performed this afternoon that showed an overall EFW of 1 pound 7 ounces (41st percentile for her gestational age).  Normal amniotic fluid is noted with a maximal vertical pocket of 5.64 cm.  The fetus was in the breech presentation.  Due to probable PPROM and her advanced cervical dilatation, the patient is receiving latency antibiotics and a complete course of antenatal corticosteroids.  The usual management and implications of PPROM were discussed with the patient and her partner.  She was advised that due to rupture of membranes, she will require inpatient management until delivery with daily fetal testing.  She should receive a complete course of antenatal corticosteroids and complete a course of latency antibiotics.  She was advised that due to PPROM, delivery will be recommended at around 34 weeks.  Delivery prior to this time will be indicated  should she go into spontaneous labor, should she show any signs of an intrauterine infection, or at any time for nonreassuring fetal status.  Should she require delivery before 32 weeks, magnesium sulfate should be given for fetal neuro protection.  Should she be at risk for delivery and it has been 7 days or greater since she received the initial course of antenatal corticosteroids, a rescue course of steroids should be given.  As her fern test was borderline with a positive amnisure test, along with the normal amniotic fluid noted on today's ultrasound, there may be a possibility that the leakage of fluid that she has experienced was due to transudate from her advanced cervical dilatation.  Given that she has advanced cervical dilatation at her current gestational age, I would continue inpatient management on modified bedrest.  She should have weekly ultrasounds to assess the amniotic fluid levels.  Should she no longer complain of leakage of fluid and her amniotic fluid levels remain within normal limits on ultrasound, another rupture membrane check may be performed later in her pregnancy to help guide management.  The patient understands that her baby will require a NICU admission should she deliver in the near future.  As the fetus is in the breech presentation, she understands that she will most likely require a cesarean delivery.   At the end of the consultation, the patient stated that all her questions had been answered to her complete satisfaction.  Thank you for referring this patient for Maternal-Fetal Medicine consultation.   Recommendations: -Inpatient management until delivery  if rupture of membranes is confirmed -Daily fetal testing -Weekly fluid checks -Latency antibiotics -Magnesium sulfate for fetal neuro protection should she require delivery before 32 weeks -Rescue course of steroids as indicated -Repeat rupture of membranes check at around 28 weeks should she no longer complain  of leakage of fluid and her amniotic fluid levels remain within normal limits as noted on ultrasound -Delivery at around 34 weeks or earlier should she go into spontaneous labor, should she show any signs of an intrauterine infection, or at any time for nonreassuring fetal status

## 2019-10-31 DIAGNOSIS — O42112 Preterm premature rupture of membranes, onset of labor more than 24 hours following rupture, second trimester: Secondary | ICD-10-CM | POA: Diagnosis present

## 2019-10-31 LAB — TYPE AND SCREEN
ABO/RH(D): A POS
Antibody Screen: NEGATIVE

## 2019-10-31 MED ORDER — COMPLETENATE 29-1 MG PO CHEW
1.0000 | CHEWABLE_TABLET | Freq: Every day | ORAL | Status: DC
  Administered 2019-10-31 – 2019-11-05 (×6): 1 via ORAL
  Filled 2019-10-31 (×7): qty 1

## 2019-10-31 NOTE — Progress Notes (Signed)
Patient ID: Ann Fields, female   DOB: 08-03-1996, 24 y.o.   MRN: [id number] Ann Fields is a 24 y.o. G1P0000 at [redacted]w[redacted]d admitted for [facility]  Hospital Day No: 4  Subjective: No complaints.  Reports small amount of yellowish leaking today and pinkish yellow yesterday.  Denies ctxs or VB and reports good FM.  Denies abdominal pain.  Objective: BP (!) 125/48 (BP Location: Right Arm) Comment: nurse notified  Pulse 79   Temp 98.1 F (36.7 C) (Oral)   Resp 18   Ht 5\' 5"  (1.651 m)   Wt 104.3 kg   LMP 05/04/2019   SpO2 100%   BMI 38.27 kg/m  I/O last 3 completed shifts: In: 3288.1 [P.O.:1620; I.V.:1468.1; IV Piggyback:200] Out: 3050 [Urine:3050] Total I/O In: 240 [P.O.:240] Out: 500 [Urine:500]  Physical Exam:  Gen: alert Chest/Lungs: cta bilaterally  Heart/Pulse: RRR  Abdomen: soft, gravid, nontender Uterine fundus: soft, nontender Skin & Color: warm and dry  EXT: no calf tenderness  FHT:  FHR: 155 bpm, variability: min-mod,  accelerations:  Abscent,  decelerations:  Absent UC:   No ctxs SVE:    exam on admission 3cm  Labs: Lab Results  Component Value Date   WBC 13.7 (H) 10/28/2019   HGB 10.8 (L) 10/28/2019   HCT 33.7 (L) 10/28/2019   MCV 85.3 10/28/2019   PLT 268 10/28/2019   GBS + from office  Assessment and Plan: has Incompetent cervix during second trimester, antepartum and PPROM 24 wks on their problem list. P0 at 24 3/7 PPROM stable Cont current mgmt Modified bedrest per MFM Cont latency abx now on po amoxicillin for a total of 5 days s/p 2 days of ampicillin and zithromax x 1 Cont valtrex daily AFI q wk per MFM Breech - recheck presentation if needs delivery and if still breech, c/s is planned Fetal status - cat 1 Daily monitoring Rescue dose of steroids if 7 days out from initial dose MgSO4 for neuroprotection if delivers prior to 32wks Plan delivery at 34 wks  Ann Fields 10/31/2019, 10:23 AM

## 2019-11-01 MED ORDER — PANTOPRAZOLE SODIUM 20 MG PO TBEC
20.0000 mg | DELAYED_RELEASE_TABLET | Freq: Every day | ORAL | Status: DC
  Administered 2019-11-01 – 2019-11-26 (×26): 20 mg via ORAL
  Filled 2019-11-01 (×26): qty 1

## 2019-11-01 MED ORDER — DOCUSATE SODIUM 100 MG PO CAPS
100.0000 mg | ORAL_CAPSULE | Freq: Two times a day (BID) | ORAL | Status: DC | PRN
  Administered 2019-11-01: 100 mg via ORAL
  Filled 2019-11-01: qty 1

## 2019-11-01 NOTE — Progress Notes (Addendum)
Cierra C. Prisco  MRN: [id number]  Subjective: Patient reports with small pink mucousy discharge on toilet paper after wiping self after using the bathroom.  Denies contractions or abdominal pain.  Denies fevers/chills.  On modified bedrest and would like to sit up in bed, we discussed may do this.    Objective: Blood pressure 125/66, pulse 92, temperature 99.7 F (37.6 C), temperature source Oral, resp. rate 18, height 5\' 5"  (1.651 m), weight 104.3 kg, last menstrual period 05/04/2019, SpO2 100 %. General: No acute distress Abdomen: Soft, non tender, gravid Extremities: Warm and well perfused, no edema, no calf tenderness EFM at 11 am: 150 baseline, moderate variabilty, reactive with 10 x 10 criteria.  Few spontaneous low amplitude decelerations.  TOCO: No contractions.    Assessment/Plan: 24 y/oG1P0 at 24 weeks 4 days EGA with cervical incompetence and 3cm dilation on presentation, and probable PPROM, stable maternal and fetal status - modified bedrest per MFM - on latency antibiotics - NST monitoring Q shift as ordered - for repeat bethamethasone course per MFM if more than 1 week from last betamethasone administration and appears to be in labor or needs to be delivered - for magnesium sulfate for neuro protection for 12 hours if she goes into labor or needs to be delivered before [redacted] weeks EGA and stable fetal and maternal status - need to check fetal presentation before delivery -  Weekly fluid checks on fridays, for repeat amnisure test if fluid continues to be normal at [redacted] weeks EGA  -Colace for constipation prn. -If remains stable plan is for delivery at [redacted] weeks EGA.   LOS: 4 days  Alinda Dooms, MD.  11/01/2019, 4:56 PM

## 2019-11-02 DIAGNOSIS — O99012 Anemia complicating pregnancy, second trimester: Secondary | ICD-10-CM | POA: Diagnosis present

## 2019-11-02 MED ORDER — POLYSACCHARIDE IRON COMPLEX 150 MG PO CAPS
150.0000 mg | ORAL_CAPSULE | Freq: Every day | ORAL | Status: DC
  Administered 2019-11-02 – 2019-11-26 (×25): 150 mg via ORAL
  Filled 2019-11-02 (×25): qty 1

## 2019-11-02 MED ORDER — MAGNESIUM OXIDE 400 (241.3 MG) MG PO TABS
400.0000 mg | ORAL_TABLET | Freq: Every day | ORAL | Status: DC
  Administered 2019-11-02 – 2019-11-26 (×25): 400 mg via ORAL
  Filled 2019-11-02 (×27): qty 1

## 2019-11-02 MED ORDER — CALCIUM POLYCARBOPHIL 625 MG PO TABS
625.0000 mg | ORAL_TABLET | Freq: Every day | ORAL | Status: DC
  Administered 2019-11-02 – 2019-11-25 (×24): 625 mg via ORAL
  Filled 2019-11-02 (×26): qty 1

## 2019-11-02 MED ORDER — ALUM & MAG HYDROXIDE-SIMETH 200-200-20 MG/5ML PO SUSP: 30.0000 mL | Freq: Four times a day (QID) | ORAL | Status: DC | PRN

## 2019-11-02 NOTE — Evaluation (Addendum)
Physical Therapy Evaluation Patient Details Name: Ann Fields MRN: [id number] DOB: [birth year] Today's Date: 11/02/2019   History of Present Illness  Pt adm with cervical incompetence and probable PPROM at [redacted] wks gestation. Pt placed on bedrest except bsc.   Clinical Impression  Pt instructed in in  bed exercises to combat deconditioning from extended bedrest. Pt demonstrated good understanding of exercises and was given a hand out. Pt verbalized understanding to discontinue ex's if she experiences any incr in contractions or discharge with ex's.  Will sign off. Please re-order if any other needs arise.     Follow Up Recommendations No PT follow up    Equipment Recommendations  None recommended by PT    Recommendations for Other Services       Precautions / Restrictions Precautions Precautions: None      Mobility  Bed Mobility               General bed mobility comments: independent per pt  Transfers                 General transfer comment: indepedent per pt  Ambulation/Gait             General Gait Details: Pt on bedrest with only bsc priveleges  Stairs            Wheelchair Mobility    Modified Rankin (Stroke Patients Only)       Balance Overall balance assessment: No apparent balance deficits (not formally assessed)                                           Pertinent Vitals/Pain Pain Assessment: No/denies pain    Home Living Family/patient expects to be discharged to:: Private residence Living Arrangements: Spouse/significant other                    Prior Function Level of Independence: Independent               Hand Dominance        Extremity/Trunk Assessment   Upper Extremity Assessment Upper Extremity Assessment: Overall WFL for tasks assessed    Lower Extremity Assessment Lower Extremity Assessment: Overall WFL for tasks assessed       Communication   Communication: No  difficulties  Cognition Arousal/Alertness: Awake/alert Behavior During Therapy: WFL for tasks assessed/performed Overall Cognitive Status: Within Functional Limits for tasks assessed                                        General Comments      Exercises Antenatal Exercises Ankle Circles/Pumps: AROM;Both;10 reps;Supine Quad Sets: Strengthening;Both;5 reps;Supine Short Arc Quad: Strengthening;Left;5 reps;Supine Sidelying Hip Flexor Stretch: PROM;Left   Assessment/Plan    PT Assessment Patent does not need any further PT services  PT Problem List         PT Treatment Interventions      PT Goals (Current goals can be found in the Care Plan section)  Acute Rehab PT Goals PT Goal Formulation: All assessment and education complete, DC therapy    Frequency     Barriers to discharge        Co-evaluation               AM-PAC PT "6 Clicks" Mobility  Outcome Measure Help needed turning from your back to your side while in a flat bed without using bedrails?: None Help needed moving from lying on your back to sitting on the side of a flat bed without using bedrails?: None Help needed moving to and from a bed to a chair (including a wheelchair)?: None Help needed standing up from a chair using your arms (e.g., wheelchair or bedside chair)?: None Help needed to walk in hospital room?: None Help needed climbing 3-5 steps with a railing? : None 6 Click Score: 24    End of Session   Activity Tolerance: Patient tolerated treatment well Patient left: in bed;with call bell/phone within reach   PT Visit Diagnosis: Other (comment)(bedrest)    Time:  -      Charges:              [place] Pager [phone] Office [place] 11/02/2019, 1:28 PM

## 2019-11-02 NOTE — Progress Notes (Addendum)
Patient ID: Ann Fields, female   DOB: 10-Jun-1996, 24 y.o.   MRN: [id number]   Ann Fields is a 24 y.o. 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  HD #5  S: Resting in bed, partner at [facility], sleeping.  Denies pain/ctx/pressure. Reports small amount of fluid DC this am, clear, no VB, discharge less than yesterday an no longer w/ mucus.  Feels in good spirits, has been using distraction therapy w/ coloring books and reading, plans to have family bring supplies from craft store for project.  Notes no BM since 3/13 am, usually BM daily. Requesting BSC for voids.  O: BP 116/64 (BP Location: Left Arm)   Pulse 97   Temp 98.2 F (36.8 C) (Oral)   Resp 18   Ht 5\' 5"  (1.651 m)   Wt 104.3 kg   LMP 05/04/2019   SpO2 100%   BMI 38.27 kg/m    PE: Physical Exam Constitutional:      General: She is awake.     Appearance: Normal appearance.  Genitourinary:     Pelvic exam was performed with patient supine.     Vulva normal.     No vulval lesion noted.     Genitourinary Comments: No LOF with valsalva  Cardiovascular:     Rate and Rhythm: Normal rate and regular rhythm.     Heart sounds: Normal heart sounds.  Pulmonary:     Effort: Pulmonary effort is normal.     Breath sounds: Normal breath sounds.  Abdominal:     Palpations: Abdomen is soft.     Tenderness: There is no abdominal tenderness.  Musculoskeletal:     Right lower leg: No edema.     Left lower leg: No edema.  Neurological:     Mental Status: She is alert.  Vitals and nursing note reviewed.      [id number]: 150 bpm, moderate variability, no accels, one decel to 120 BPM x 1 min noted , none repettitive Toco: None SVE: deferred, was 3cm on admit Presentation: (breech) per sono 10/29/19 AGA, AFI wnl, anterior placenta, MCI  A/P- 24 y.o. admitted with cervical incompetence and probable PPROM  Preterm labor management: modified  bedrest advised Dating:  [redacted]w[redacted]d * PNL Needed:  3T labs at 28 wks * FWB:  Reassuring for gestational age * PTL:  Mag Sulfate x 12 H 10/28/19             BMZ course 3/11, 3/12 * MFM consult completed  Recommendations:  -Inpatient management until delivery if rupture of membranes  is confirmed  -Daily fetal testing, EFM 30 min q shift  -Weekly fluid checks  -Latency antibiotics  -Magnesium sulfate for fetal neuro protection should she require delivery before 32 weeks  -Rescue course of steroids if >1 week since last dose if impending delivery  -Repeat rupture of membranes check at around 28 weeks should she no longer complain of leakage of fluid and her amniotic fluid levels remain within normal limits as noted on ultrasound  -Delivery at around 34 weeks or earlier should she go into spontaneous labor, should she show any signs of an intrauterine infection, or at any time fornonreassuring fetal status  * H/O of HSV only with labs - valtrex daily * NICU consult completed 3/11, available PRN for further patient questions * Hx asthma - albuterol MDI PRN * Constipation - colace daily, may add one serving caffeine q  am for gut motility, fiber supplement daily and encourage fiber in diet, may be up on [facility] for voids and BM's, consider Maalox if no BM by tomorrow * Pregnancy anemia (hgb 10.8) - started oral Fe (polysaccharide) and Mag Ox for constipation prevention * Modified bed rest per MFM - PT consult pending for bed exercises, distraction therapy encouraged, SCD's continuous while in bed. May consider WC ride to patio for 1 hr daily if AFI stable at next sono this Friday  ROD: likely cesarean section for breech presentation   Update to Dr. Havery Moros, CNM, MSN 11/02/2019, 9:51 AM

## 2019-11-03 LAB — TYPE AND SCREEN
ABO/RH(D): A POS
Antibody Screen: NEGATIVE

## 2019-11-03 NOTE — Progress Notes (Addendum)
Patient ID: Ann Fields, female   DOB: 08/12/1996, 24 y.o.   MRN: [id number]   Ann Fields is a 24 y.o. 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  HD #6  S: Resting in bed, partner at [facility], sleeping.  Denies pain/ctx/pressure. Reports small amount of fluid DC when using the restroom last night, clear, no VB, states some of the discharge is watery and some is mucous.  Feeling well, asking lots of questions. Answered all questions. States she had a BM yesterday and it was soft.   O: BP 108/65 (BP Location: Right Arm)   Pulse (!) 107   Temp 98.4 F (36.9 C) (Oral)   Resp 20   Ht 5\' 5"  (1.651 m)   Wt 104.3 kg   LMP 05/04/2019   SpO2 98%   BMI 38.27 kg/m    PE: Physical Exam Constitutional:      General: She is awake.     Appearance: Normal appearance.  Genitourinary:     Pelvic exam was performed with patient supine.     Vulva normal.     No vulval lesion noted.     Genitourinary Comments: No LOF with valsalva  Cardiovascular:     Rate and Rhythm: Normal rate and regular rhythm.     Heart sounds: Normal heart sounds.  Pulmonary:     Effort: Pulmonary effort is normal.     Breath sounds: Normal breath sounds.  Abdominal:     Palpations: Abdomen is soft.     Tenderness: There is no abdominal tenderness.  Musculoskeletal:     Right lower leg: No edema.     Left lower leg: No edema.  Neurological:     Mental Status: She is alert.  Vitals and nursing note reviewed.    [id number]: 150 bpm, moderate variability, + accels, no decels on NST last night  Toco: None SVE: deferred, was 3cm on admit Presentation: (breech) per sono 10/29/19 AGA, AFI wnl, anterior placenta, MCI  A/P- 24 y.o. admitted with cervical incompetence and probable PPROM  Preterm labor management: modified bedrest advised Dating:  [redacted]w[redacted]d * PNL Needed:  3T labs at 28 wks * FWB:  Reassuring for gestational age *  PTL:  Mag Sulfate x 12 H 10/28/19             BMZ course 3/11, 3/12 * MFM consult completed  Recommendations:  -Inpatient management until delivery if rupture of membranes  is confirmed  -Daily fetal testing, EFM 30 min q shift  -Weekly fluid checks  -Latency antibiotics  -Magnesium sulfate for fetal neuro protection should she require delivery before 32 weeks  -Rescue course of steroids if >1 week since last dose if impending delivery  -Repeat rupture of membranes check at around 28 weeks should she no longer complain of leakage of fluid and her amniotic fluid levels remain within normal limits as noted on ultrasound  -Delivery at around 34 weeks or earlier should she go into spontaneous labor, should she show any signs of an intrauterine infection, or at any time fornonreassuring fetal status * H/O of HSV only with labs - valtrex daily * NICU consult completed 3/11, available PRN for further patient questions * Hx asthma - albuterol MDI PRN * Constipation - colace daily, may add one serving caffeine q am for gut motility, fiber supplement daily and encourage fiber in diet, may be up on [facility] for voids and  BM's, consider Maalox if no BM by tomorrow * Pregnancy anemia (hgb 10.8) - started oral Fe (polysaccharide) and Mag Ox for constipation prevention * Modified bed rest per MFM, distraction therapy encouraged, SCD's continuous while in bed. May consider WC ride to patio for 1 hr daily if AFI stable at next sono this Friday  ROD: likely cesarean section for breech presentation   Update to Dr. Sanda Linger, CNM, MSN 11/03/2019, 9:53 AM  MD Addendum: I reviewed chart and EFM tracing at 1740 and agree with above findings, assessment and plan as outlined above by CNM Gavin Potters.  Dr. Alesia Richards. 11/03/2019 1744.

## 2019-11-04 NOTE — Progress Notes (Signed)
Initial Nutrition Assessment  DOCUMENTATION CODES:   Obesity unspecified  INTERVENTION:  Regular Diet Snacks TID and double protein portions upon request   NUTRITION DIAGNOSIS:   Increased nutrient needs related to (pregnancy and fetal growth requirements) as evidenced by (25 weeks IUP).  GOAL:   Patient will meet greater than or equal to 90% of their needs  MONITOR:   Weight trends  REASON FOR ASSESSMENT:   Antenatal, LOS   ASSESSMENT:   25 weeks IUP, adm due to PPROM. Ht 65 " prepreg wt 103.1 Kg, BMI 37.8. 2 lb weight gain. 3/11 Hct 33.7%, on iron supplement, PNV  Diet Order:   Diet Order            Diet regular Room service appropriate? Yes; Fluid consistency: Thin  Diet effective now              EDUCATION NEEDS:   No education needs have been identified at this time  Skin:  Skin Assessment: Reviewed RN Assessment  Last BM:   3/18, on fiber supplement  Height:   Ht Readings from Last 1 Encounters:  10/28/19 5\' 5"  (1.651 m)    Weight:   Wt Readings from Last 1 Encounters:  10/28/19 104.3 kg    Ideal Body Weight:   125 lbs  BMI:  Body mass index is 38.27 kg/m.  Estimated Nutritional Needs:   Kcal:  2200-2400  Protein:  97-107 g  Fluid:  2.4 L    Weyman Rodney M.Fredderick Severance LDN Neonatal Nutrition Support Specialist/RD III

## 2019-11-04 NOTE — Progress Notes (Signed)
Antepartum Progress Note  Patient ID: Ann Fields, female   DOB: August 02, 1996, 24 y.o.   MRN: [id number]   LOS#7: Ann Fields is a 24 y.o. G1P0000 at [redacted]w[redacted]d admitted for PPROM of unknown date and time.  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  S: Resting in bed, partner at [facility], sleeping. Denies pain/ctx/pressure. +FM. Reports small amount of fluid DC when using the restroom last night, clear, no VB, states some of the discharge is watery and some is mucous. Last BM on 3/16, soft.   O: BP (!) 101/52 (BP Location: Left Arm)   Pulse (!) 113   Temp 98.1 F (36.7 C) (Oral)   Resp 20   Ht 5\' 5"  (1.651 m)   Wt 104.3 kg   LMP 05/04/2019   SpO2 99%   BMI 38.27 kg/m    PE: Physical Exam Constitutional:      General: She is awake.     Appearance: Normal appearance.  Genitourinary:     Pelvic exam was performed with patient supine.     Vulva normal.     No vulval lesion noted.     Genitourinary Comments: No LOF with valsalva  Cardiovascular:     Rate and Rhythm: Normal rate and regular rhythm.     Heart sounds: Normal heart sounds.  Pulmonary:     Effort: Pulmonary effort is normal.     Breath sounds: Normal breath sounds.  Abdominal:     Palpations: Abdomen is soft.     Tenderness: There is no abdominal tenderness.  Musculoskeletal:     Right lower leg: No edema.     Left lower leg: No edema.  Neurological:     Mental Status: She is alert.  Vitals and nursing note reviewed.    [id number]: 150 bpm, moderate variability, + accels, no decels on NST @0200 , today.   Toco: None SVE: deferred, was 3cm on admit Presentation: (breech) per sono 10/29/19 AGA, AFI wnl, anterior placenta, MCI  A/P- LOS#7: Ann Fields is a 24 y.o. G1P0000 at [redacted]w[redacted]d admitted for PPROM of unknown date and time. 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  PPROM: PTL with 3cm dilation, possible  PPROM on 3/11 @ [facility] visit but questionable it occurring two weeks prior with leakage of fluids, fern-, amnisure+, pt stable now, +leakge, afebrile, S/P latency abx, UC resulted multiple species, recollection recommended, pt already received x7 day abx, no further collection will be performed at this time. Plan to ask Dr Mancel Bale about GBS. Plan: Inpatient management until delivery if rupture of membranes is confirmed, Daily fetal testing, EFM 30 min q shift, Weekly fluid checks (Fridays), Repeat rupture of membranes check at around 28 weeks should she no longer complain of leakage of fluid and her amniotic fluid levels remain within normal limits as noted on ultrasound, Delivery at around 34 weeks or earlier should she go into spontaneous labor, should she show any signs of an intrauterine infection, or at any time fornonreassuring fetal status. Modified bed rest per MFM, distraction therapy encouraged, SCD's continuous while in bed. May consider WC ride to patio for 1 hr daily if AFI stable at next sono this Friday.  HSV+: No prodromal s/sx, no active lesion, plan to continue daily valtrex 1000mg  PO daily for prophylactics.   Asthma: Stable, asymptomatic. Plan for Albuterol MDI PRN for wheezing.   Fetal Well-being: Stable, +FM, NST  reassuring for gestational age. BMZ completed on 3/11 & 3/12, plan to give rescue dose if eminent delivery. S/P 12 H magnesium, plan to give if >32weeks and eminent delivery for neuroprotection. Last [place] 3/13: [place] breech, MCI, Anterior placenta, AFI WNL, EFW was 1.7 lbs, 41%. Continue QShift NST. NICU consult completed 3/11, available PRN for further patient questions.  Constipation: improving, Last BM 3/16, colace daily, may add one serving caffeine q am for gut motility, fiber supplement daily and encourage fiber in diet, may be up on [facility] for voids and BM's, consider Maalox if no BM.  Pregnancy anemia: stable, asymptomatic, (hgb 10.8), plan to continue oral Fe  (polysaccharide) and Mag Ox for constipation prevention.  DR Mancel Bale to be updated on pt status.   [name], CNM, MSN 11/04/2019, 8:04 AM

## 2019-11-05 ENCOUNTER — Inpatient Hospital Stay (HOSPITAL_BASED_OUTPATIENT_CLINIC_OR_DEPARTMENT_OTHER): Payer: BC Managed Care – PPO

## 2019-11-05 DIAGNOSIS — O43192 Other malformation of placenta, second trimester: Secondary | ICD-10-CM | POA: Diagnosis not present

## 2019-11-05 DIAGNOSIS — Z3A25 25 weeks gestation of pregnancy: Secondary | ICD-10-CM

## 2019-11-05 DIAGNOSIS — O99212 Obesity complicating pregnancy, second trimester: Secondary | ICD-10-CM

## 2019-11-05 DIAGNOSIS — O42912 Preterm premature rupture of membranes, unspecified as to length of time between rupture and onset of labor, second trimester: Secondary | ICD-10-CM

## 2019-11-05 DIAGNOSIS — O3432 Maternal care for cervical incompetence, second trimester: Secondary | ICD-10-CM | POA: Diagnosis not present

## 2019-11-05 DIAGNOSIS — E669 Obesity, unspecified: Secondary | ICD-10-CM

## 2019-11-05 MED ORDER — LACTATED RINGERS IV BOLUS
500.0000 mL | Freq: Once | INTRAVENOUS | Status: AC
  Administered 2019-11-05: 500 mL via INTRAVENOUS

## 2019-11-05 NOTE — Progress Notes (Signed)
Antepartum Progress Note  Patient ID: Ann Fields, female   DOB: 13-Oct-1995, 24 y.o.   MRN: [id number]   LOS#8: Ann Fields is a 24 y.o. G1P0000 at [redacted]w[redacted]d admitted for PPROM of unknown date and time.  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  S: Resting in bed, desires to be wheeled around today in wheelchair for different views, requesting work note stating she is on bed rest, sleeping. Denies pain/ctx/pressure. +FM. Reports small amount of fluid DC when using the restroom last night, clear, no VB, states some of the discharge is watery and some is mucous, but endorses has slowed down a lot. Last BM on 3/16, soft.   O: BP 127/79   Pulse (!) 101   Temp 98.1 F (36.7 C) (Oral)   Resp 16   Ht 5\' 5"  (1.651 m)   Wt 104.3 kg   LMP 05/04/2019   SpO2 99%   BMI 38.27 kg/m    PE: Physical Exam Constitutional:      General: She is awake.     Appearance: Normal appearance.  Genitourinary:     Pelvic exam was performed with patient supine.     Vulva normal.     No vulval lesion noted.     Genitourinary Comments: No LOF with valsalva  Cardiovascular:     Rate and Rhythm: Normal rate and regular rhythm.     Heart sounds: Normal heart sounds.  Pulmonary:     Effort: Pulmonary effort is normal.     Breath sounds: Normal breath sounds.  Abdominal:     Palpations: Abdomen is soft.     Tenderness: There is no abdominal tenderness.  Musculoskeletal:     Right lower leg: No edema.     Left lower leg: No edema.  Neurological:     Mental Status: She is alert.  Vitals and nursing note reviewed.    [id number]: 150 bpm, moderate variability, + accels, no decels on NST @1000 , today.   Toco: None SVE: deferred, was 3cm on admit Presentation: (breech) per sono 10/29/19 AGA, AFI wnl, anterior placenta, MCI  A/P- LOS#8: Ann Fields is a 24 y.o. G1P0000 at [redacted]w[redacted]d admitted for PPROM of unknown date and time. 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  PPROM: PTL with 3cm dilation, possible PPROM on 3/11 @ [facility] visit but questionable it occurring two weeks prior with leakage of fluids, fern-, amnisure+, pt stable now, +leakge, afebrile, S/P latency abx, UC resulted multiple species, recollection recommended, pt already received x7 day abx, no further collection will be performed at this time. GBS Positive, plan to treat with penicillin in labor. Plan: Inpatient management until delivery if rupture of membranes is confirmed, Daily fetal testing, EFM 30 min q shift, Weekly fluid checks (Fridays), Repeat rupture of membranes check at around 28 weeks should she no longer complain of leakage of fluid and her amniotic fluid levels remain within normal limits as noted on ultrasound, Delivery at around 34 weeks or earlier should she go into spontaneous labor, should she show any signs of an intrauterine infection, or at any time fornonreassuring fetal status. Modified bed rest per MFM, distraction therapy encouraged, SCD's continuous while in bed. May have [place] ride to patio for 1 hr daily if AFI remains stable. Will ask Dr Alesia Richards about having bathroom privileges. Pt given work note for being on bedrest.   HSV+: No  prodromal s/sx, no active lesion, plan to continue daily valtrex 1000mg  PO daily for prophylactics.   Asthma: Stable, asymptomatic. Plan for Albuterol MDI PRN for wheezing.   Fetal Well-being: Stable, +FM, NST reassuring for gestational age. BMZ completed on 3/11 & 3/12, plan to give rescue dose if eminent delivery. S/P 12 H magnesium, plan to give if >32weeks and eminent delivery for neuroprotection. Last [place] 3/19: vertex, MCI, Anterior placenta, AFI WNL, MVP 4.94 down from 5.64, EFW was 1.7 lbs, 41% on [place] 3/13. Continue QShift NST. NICU consult completed 3/11, available PRN for further patient questions.  Constipation: improving, Last BM 3/16, colace daily, may add one serving  caffeine q am for gut motility, fiber supplement daily and encourage fiber in diet, may be up on [facility] for voids and BM's, consider Maalox if no BM.  Pregnancy anemia: stable, asymptomatic, (hgb 10.8), plan to continue oral Fe (polysaccharide) and Mag Ox for constipation prevention.  DR Alesia Richards to be updated on pt status.   [name], CNM, MSN 11/05/2019, 9:26 AM

## 2019-11-06 MED ORDER — PRENATAL MULTIVITAMIN CH
1.0000 | ORAL_TABLET | Freq: Every day | ORAL | Status: DC
  Administered 2019-11-06 – 2019-11-25 (×20): 1 via ORAL
  Filled 2019-11-06 (×21): qty 1

## 2019-11-06 NOTE — Progress Notes (Addendum)
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  HD #9  S: Resting in bed, partner asleep at [facility]. Reports feeling well, BM yesterday, minimal LOF, clear. Denies cramping/pain, had a few mild ctx overnight, less discomfort with those than from menstrual cramps.  O: BP (!) 101/55 (BP Location: Left Arm)   Pulse 94   Temp 98.6 F (37 C) (Oral)   Resp 16   Ht 5\' 5"  (1.651 m)   Wt 104.3 kg   LMP 05/04/2019   SpO2 100%   BMI 38.27 kg/m      [id number]: 150 bpm Toco: occasional rare ctx [id number]  A/P:24 y.o. admitted with cervical incompetence and probable PPROM  Preterm labor management: modified bedrest advised Afebrile, no s/sx chorio Dating:  [redacted]w[redacted]d  * PNL Needed:  3T labs at 28 wks * FWB:  Reassuring for gestational age * PTL:  Mag Sulfate x 12 H 10/28/19             BMZ course 3/11, 3/12 * Sono 3/19 - vertex, MCI, Anterior placenta, AFI WNL, MVP 4.94 down from 5.64, EFW was 1.7 lbs, 41% on [place] 3/13.  * MFM consult completed  Recommendations: -Inpatient management until delivery if rupture of membranes is confirmed -Daily fetal testing, EFM 30 min q shift -Weekly fluid checks -Latency antibiotics completed -Magnesium sulfate for fetal neuro protection should sherequire delivery before 32 weeks -Rescue course of steroids if >1 week since last dose if impending delivery -Repeat rupture of membranes check at around 28 weeksshould she no longer complain of leakage of fluid and heramniotic fluid levels remain within normal limits as noted onultrasound -Delivery at around 34 weeks or earlier should she go intospontaneous labor, should she show any signs of anintrauterine infection, or at any time fornonreassuring fetalstatus  * H/O of HSV only with labs - valtrex daily * NICU consult completed 3/11, available PRN for further patient questions * Hx  asthma - albuterol MDI PRN * Constipation - improved, BM q day or every other day on colace daily and fiber supplement. Nutrition consult completed.  * Pregnancy anemia (hgb 10.8) - started oral Fe (polysaccharide) and Mag Ox for constipation prevention * Modified bed rest per MFM - PT consult completed for bed exercises, distraction therapy encouraged, SCD's continuous while in bed. WC ride to patio for 1 hr daily with stable AFI. May have BR privileges and may shower while sitting.   POC in consult w/ Dr. Alesia Richards.   Juliene Pina, CNM, MSN 11/06/2019, 8:37 AM  Attestation of Attending Supervision of Advanced Practitioner (CNM/NP): Evaluation and management procedures were performed by the Advanced Practitioner under my supervision and collaboration.  I have reviewed the Advanced Practitioner's note and chart, and I agree with the management and plan. I saw and examined patient at bedside and agree with above findings, assessment and plan as outlined above by CNM Juliene Pina. I reviewed NST at 1350: Baseline 150, moderate variability, reactive, no decelerations. TOCO: No contractions.  Dr. Alesia Richards.  11/06/2019. 1530.

## 2019-11-07 LAB — TYPE AND SCREEN
ABO/RH(D): A POS
Antibody Screen: NEGATIVE

## 2019-11-07 MED ORDER — SODIUM CHLORIDE 0.9% FLUSH
3.0000 mL | Freq: Two times a day (BID) | INTRAVENOUS | Status: DC
  Administered 2019-11-07 – 2019-11-25 (×34): 3 mL via INTRAVENOUS

## 2019-11-07 MED ORDER — ACETAMINOPHEN 500 MG PO TABS
1000.0000 mg | ORAL_TABLET | Freq: Four times a day (QID) | ORAL | Status: DC | PRN
  Administered 2019-11-07 – 2019-11-15 (×3): 1000 mg via ORAL
  Filled 2019-11-07 (×3): qty 2

## 2019-11-07 NOTE — Progress Notes (Signed)
Antepartum Progress Note  Patient ID: Ann Fields, female   DOB: November 10, 1995, 24 y.o.   MRN: [id number]   LOS#10: Ann Fields is a 24 y.o. G1P0000 at [redacted]w[redacted]d admitted for PPROM of unknown date and time. 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  S: Resting in bed. Denies pain/ctx/pressure. +FM. Reports small amount of fluid DC when using the restroom last night, clear, no VB, states some of the discharge is watery and some is mucous, but endorses has slowed down a lot.  O: BP (!) 92/49 (BP Location: Left Arm) Comment: RN notified  Pulse 99   Temp 98.3 F (36.8 C) (Oral)   Resp 18   Ht 5\' 5"  (1.651 m)   Wt 104.3 kg   LMP 05/04/2019   SpO2 97%   BMI 38.27 kg/m    PE: Physical Exam Constitutional:      General: She is awake.     Appearance: Normal appearance.  Genitourinary:     Pelvic exam was performed with patient supine.     Vulva normal.     No vulval lesion noted.     Genitourinary Comments: No LOF with valsalva  Cardiovascular:     Rate and Rhythm: Normal rate and regular rhythm.     Heart sounds: Normal heart sounds.  Pulmonary:     Effort: Pulmonary effort is normal.     Breath sounds: Normal breath sounds.  Abdominal:     Palpations: Abdomen is soft.     Tenderness: There is no abdominal tenderness.  Musculoskeletal:     Right lower leg: No edema.     Left lower leg: No edema.  Neurological:     Mental Status: She is alert.  Vitals and nursing note reviewed.    [id number]: 145 bpm, moderate variability, + accels, no decels on NST @0000 , today.   Toco: None SVE: deferred, was 3cm on admit Presentation: (breech) per sono 10/29/19 AGA, AFI wnl, anterior placenta, MCI  A/P- LOS#10: Ann Fields is a 24 y.o. G1P0000 at [redacted]w[redacted]d admitted for PPROM of unknown date and time. 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  PPROM: PTL with 3cm dilation, possible  PPROM on 3/11 @ [facility] visit but questionable it occurring two weeks prior with leakage of fluids, fern-, amnisure+, pt stable now, +leakge, afebrile, S/P latency abx, UC resulted multiple species, recollection recommended, pt already received x7 day abx, no further collection will be performed at this time. GBS Positive, plan to treat with penicillin in labor. Plan: Inpatient management until delivery if rupture of membranes is confirmed, Daily fetal testing, EFM 30 min q shift, Weekly fluid checks (Fridays), Repeat rupture of membranes check at around 28 weeks should she no longer complain of leakage of fluid and her amniotic fluid levels remain within normal limits as noted on ultrasound, Delivery at around 34 weeks or earlier should she go into spontaneous labor, should she show any signs of an intrauterine infection, or at any time fornonreassuring fetal status. Modified bed rest per MFM, distraction therapy encouraged, SCD's continuous while in bed. May have [place] ride to patio for 1 hr daily if AFI remains stable. Bathroom privileges. Pt given work note for being on bedrest.   HSV+: No prodromal s/sx, no active lesion, plan to continue daily valtrex 1000mg  PO daily for prophylactics.   Asthma: Stable, asymptomatic. Plan for Albuterol MDI PRN for wheezing.  Fetal Well-being: Stable, +FM, NST reassuring for gestational age. BMZ completed on 3/11 & 3/12, plan to give rescue dose if eminent delivery. S/P 12 H magnesium, plan to give if >32weeks and eminent delivery for neuroprotection. Last [place] 3/19: vertex, MCI, Anterior placenta, AFI WNL, MVP 4.94 down from 5.64, EFW was 1.7 lbs, 41% on [place] 3/13. Continue QShift NST. NICU consult completed 3/11, available PRN for further patient questions.  Constipation: improving, Last BM 3/17, colace daily, may add one serving caffeine q am for gut motility, fiber supplement daily and encourage fiber in diet, may be up on [facility] for voids and BM's, consider Maalox if no  BM.  Pregnancy anemia: stable, asymptomatic, (hgb 10.8), plan to continue oral Fe (polysaccharide) and Mag Ox for constipation prevention.  DR Alwyn Pea to be updated on pt status.   Ann Fields, CNM, MSN 11/07/2019, 9:30 AM

## 2019-11-08 MED ORDER — ENOXAPARIN SODIUM 60 MG/0.6ML ~~LOC~~ SOLN
0.5000 mg/kg | SUBCUTANEOUS | Status: DC
  Administered 2019-11-08 – 2019-11-26 (×19): 50 mg via SUBCUTANEOUS
  Filled 2019-11-08 (×19): qty 0.6

## 2019-11-08 NOTE — Progress Notes (Signed)
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  HD #11  S: Resting in bed, partner asleep at [facility]. Reports feeling well, BM yesterday, minimal LOF, clear. Denies cramping/pain, had a few mild ctx overnight, less discomfort with those than from menstrual cramps.  O: BP 120/73 (BP Location: Left Arm)   Pulse (!) 102   Temp 98.1 F (36.7 C) (Oral)   Resp 18   Ht 5\' 5"  (1.651 m)   Wt 104.3 kg   LMP 05/04/2019   SpO2 96%   BMI 38.27 kg/m     FHT: 130s [id number]  A/P:24 y.o. admitted with cervical incompetence and probable PPROM  Preterm labor management: modified bedrest advised Afebrile, no s/sx chorio Dating:  [redacted]w[redacted]d  * PNL Needed:  3T labs at 28 wks * FWB:  Reassuring for gestational age * PTL:  Mag Sulfate x 12 H 10/28/19             BMZ course 3/11, 3/12 * Sono 3/19 - vertex, MCI, Anterior placenta, AFI WNL, MVP 4.94 down from 5.64, EFW was 1.7 lbs, 41% on [place] 3/13.  * MFM consult completed  Recommendations: -Inpatient management until delivery if rupture of membranes is confirmed -Daily fetal testing, EFM 30 min q shift -Weekly fluid checks -Latency antibiotics completed -Magnesium sulfate for fetal neuro protection should sherequire delivery before 32 weeks -Rescue course of steroids if >1 week since last dose if impending delivery -Repeat rupture of membranes check at around 28 weeksshould she no longer complain of leakage of fluid and heramniotic fluid levels remain within normal limits as noted onultrasound -Delivery at around 34 weeks or earlier should she go intospontaneous labor, should she show any signs of anintrauterine infection, or at any time fornonreassuring fetalstatus  * H/O of HSV only with labs - valtrex daily * NICU consult completed 3/11, available PRN for further patient questions * Hx asthma - albuterol MDI PRN *  Constipation - improved, BM q day or every other day on colace daily and fiber supplement. Nutrition consult completed.  * Pregnancy anemia (hgb 10.8) - started oral Fe (polysaccharide) and Mag Ox for constipation prevention * Modified bed rest per MFM - PT consult completed for bed exercises, distraction therapy encouraged, SCD's continuous while in bed. WC ride to patio for 1 hr daily with stable AFI. May have BR privileges and may shower while sitting. *Plan of care discussed with Dr. Cletis Media. Begin prophylaxis Lovenox   Ann Fields, CNM

## 2019-11-08 NOTE — Plan of Care (Signed)
  Problem: Nutrition: Goal: Adequate nutrition will be maintained Outcome: Progressing   

## 2019-11-09 NOTE — Progress Notes (Signed)
Patient ID: Ann Fields, female   DOB: 10-18-1995, 24 y.o.   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  HD #12  S: Resting in bed, EFM applied after breakfast. Denies fever, pelvic pressure, rectal pressure, vaginal discharge and bleeding. + FM. Notes small gush of clear fluid yesterday before the shower, enough to soak underwear, none since.  Mood is upbeat, coping well with inpatient restrictions.   O: BP 125/72 (BP Location: Left Arm)   Pulse (!) 103   Temp 97.8 F (36.6 C) (Oral)   Resp 18   Ht 5\' 5"  (1.651 m)   Wt 104.3 kg   LMP 05/04/2019   SpO2 100%   BMI 38.27 kg/m    [id number]: 150 bpm, mod var, small accels, small rare variables Toco: occasional [id number]   A/P:24 y.o.admitted with cervical incompetence and probable PPROM Preterm labor management:modified bedrest advised Afebrile, no s/sx chorio Dating: [redacted]w[redacted]d  *PNL Needed:3T labs at 28 wks *[id number] for gestational age *PTL:Mag Sulfate x 12 H 10/28/19 BMZ course 3/11, 3/12 * Sono 3/19 - vertex, MCI, Anterior placenta, AFI WNL, MVP 4.94 down from 5.64, EFW was 1.7 lbs, 41%on [place] 3/13.  *MFMconsult completed Recommendations: -Inpatient management until delivery if rupture of membranes is confirmed -Daily fetal testing, EFM 30 min q shift -Weekly fluid checks -Latency antibiotics completed -Magnesium sulfate for fetal neuro protection should sherequire delivery before 32 weeks -Rescue course of steroidsif >1 week since last dose if impending delivery -Repeat rupture of membranes check at around 28 weeksshould she no longer complain of leakage of fluid and heramniotic fluid levels remain within normal limits as noted onultrasound -Delivery at around 34 weeks or earlier should she go intospontaneous labor, should she show any  signs of anintrauterine infection, or at any time fornonreassuring fetalstatus  *H/O of HSV only with labs-valtrex daily *NICU consultcompleted 3/11, available PRN for further patient questions * Hx asthma - albuterol MDI PRN *Constipation- improved, BM q day or every other day on colace daily and fiber supplement. Nutrition consult completed.  * Pregnancy anemia (hgb 10.8) - started oral Fe(polysaccharide)and Mag Ox for constipation prevention * Modified bed rest per MFM -   -PT consult completed for bed exercises, distraction therapy encouraged,  -Lovenox prophylaxis started 3/22  - WC ride to patio for 1 hr daily with stable AFI. May have BR privileges and may shower while sitting.  ROD: to be determined per presentation  LOS: 12 days  Update to Dr. Honor Junes, CNM, MSN 11/09/2019, 10:47 AM

## 2019-11-10 LAB — TYPE AND SCREEN
ABO/RH(D): A POS
Antibody Screen: NEGATIVE

## 2019-11-10 NOTE — MAU Note (Signed)
covid order entered on wrong patient

## 2019-11-10 NOTE — Progress Notes (Signed)
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  HD #13  S: Resting in bed, EFM applied after breakfast. Denies fever, pelvic pressure, rectal pressure, vaginal discharge and bleeding. + FM. Notes small gush of clear fluid yesterday before the shower, enough to soak underwear, none since.  Mood is upbeat, coping well with inpatient restrictions.   Vitals:   11/10/19 0700 11/10/19 0813  BP:  95/67  Pulse:  (!) 109  Resp:  17  Temp:  98.2 F (36.8 C)  SpO2: 100% 100%   [id number]: 150 bpm, mod var, accels noted Toco: occasional [id number]   A/P:24 y.o.admitted with cervical incompetence and probable PPROM Preterm labor management:modified bedrest advised Afebrile, no s/sx chorio Dating: [redacted]w[redacted]d  *PNL Needed:3T labs at 28 wks *[id number] for gestational age *PTL:Mag Sulfate x 12 H 10/28/19 BMZ course 3/11, 3/12 * Sono 3/19 - vertex, MCI, Anterior placenta, AFI WNL, MVP 4.94 down from 5.64, EFW was 1.7 lbs, 41%on [place] 3/13.  *MFMconsult completed Recommendations: -Inpatient management until delivery if rupture of membranes is confirmed -Daily fetal testing, EFM 30 min q shift -Weekly fluid checks -Latency antibiotics completed -Magnesium sulfate for fetal neuro protection should sherequire delivery before 32 weeks -Rescue course of steroidsif >1 week since last dose if impending delivery -Repeat rupture of membranes check at around 28 weeksshould she no longer complain of leakage of fluid and heramniotic fluid levels remain within normal limits as noted onultrasound -Delivery at around 34 weeks or earlier should she go intospontaneous labor, should she show any signs of anintrauterine infection, or at any time fornonreassuring fetalstatus  *H/O of HSV only with labs-valtrex daily *NICU consultcompleted  3/11, available PRN for further patient questions * Hx asthma - albuterol MDI PRN *Constipation- improved, BM q day or every other day on colace daily and fiber supplement. Nutrition consult completed.  * Pregnancy anemia (hgb 10.8) - started oral Fe(polysaccharide)and Mag Ox for constipation prevention * Modified bed rest per MFM -              -PT consult completed for bed exercises, distraction therapy encouraged,  -Lovenox prophylaxis started 3/22             - WC ride to patio for 1 hr daily with stable AFI. May have BR privileges and may shower while sitting.  ROD: to be determined per presentation  LOS: 12 days  Update to Dr. Alwyn Pea

## 2019-11-11 NOTE — Progress Notes (Signed)
Antepartum Progress Note  Patient ID: Ann Fields, female   DOB: 1996/07/27, 24 y.o.   MRN: [id number]   LOS#14: Ann Fields is a 24 y.o. G1P0000 at [redacted]w[redacted]d admitted for PPROM of unknown date and time, most likely around three weeks ago now. 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  S: Resting in bed. Denies pain/ctx/pressure. +FM. Reports small amount of fluid DC when using the restroom last night, clear, no VB, states some of the discharge is watery and some is mucous, but endorses has slowed down a lot, since the beginning and has Remained uncharged for last week. Pt denies any further questions today. Pt mood is stable, coping well with in pt care.   O: BP (!) 102/52 (BP Location: Left Arm)   Pulse 84   Temp 98.3 F (36.8 C) (Oral)   Resp 17   Ht 5\' 5"  (1.651 m)   Wt 104.3 kg   LMP 05/04/2019   SpO2 98%   BMI 38.27 kg/m    PE: Physical Exam Constitutional:      General: She is awake.     Appearance: Normal appearance.  Genitourinary:     Pelvic exam was performed with patient supine.     Vulva normal.     No vulval lesion noted.     Genitourinary Comments: No LOF with valsalva  Cardiovascular:     Rate and Rhythm: Normal rate and regular rhythm.     Heart sounds: Normal heart sounds.  Pulmonary:     Effort: Pulmonary effort is normal.     Breath sounds: Normal breath sounds.  Abdominal:     Palpations: Abdomen is soft.     Tenderness: There is no abdominal tenderness.  Musculoskeletal:     Right lower leg: No edema.     Left lower leg: No edema.  Neurological:     Mental Status: She is alert.  Vitals and nursing note reviewed.    [id number]: 150 bpm, moderate variability, + accels, x1 variable noted decels on NST, resolved spontaneously with nadir of 120 @0200 , today.   Toco: None SVE: deferred, was 3cm on admit Presentation: (Vertex) per sono 11/05/19 AGA, AFI wnl, anterior placenta, MCI  A/P- LOS#14:  Ann Fields is a 24 y.o. G1P0000 at [redacted]w[redacted]d admitted for PPROM of unknown date and time, currently most likely approx. 3 weeks ago. 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  PPROM: PTL with 3cm dilation, possible PPROM on 3/11 @ [facility] visit but questionable it occurring two weeks prior with leakage of fluids, fern-, amnisure+, pt stable now, decreased leakage, more like discharge, afebrile, S/P latency abx, UC resulted multiple species, recollection recommended, pt already received x7 day abx, no further collection will be performed at this time. GBS Positive, plan to treat with penicillin in labor. Plan: Inpatient management until delivery if rupture of membranes is confirmed, Daily fetal testing, EFM 30 min q shift, Weekly fluid checks (Fridays), Repeat rupture of membranes check at around 28 weeks should she no longer complain of leakage of fluid and her amniotic fluid levels remain within normal limits as noted on ultrasound, Delivery at around 34 weeks or earlier should she go into spontaneous labor, should she show any signs of an intrauterine infection, or at any time fornonreassuring fetal status. Modified bed rest per MFM, distraction therapy encouraged, SCD's continuous while in bed. May have [place] ride to  patio for 1 hr daily if AFI remains stable. Bathroom privileges. Pt given work note for being on bedrest.   HSV+: No prodromal s/sx, no active lesion, plan to continue daily valtrex 1000mg  PO daily for prophylactics.   Asthma: Stable, asymptomatic. Plan for Albuterol MDI PRN for wheezing.   Fetal Well-being: Stable, +FM, NST reassuring for gestational age. BMZ completed on 3/11 & 3/12, plan to give rescue dose if eminent delivery. S/P 12 H magnesium, plan to give if >32weeks and eminent delivery for neuroprotection. Last [place] 3/19: vertex, MCI, Anterior placenta, AFI WNL, MVP 4.94 down from 5.64, EFW was 1.7 lbs, 41% on [place] 3/13. Continue  QShift NST. NICU consult completed 3/11, available PRN for further patient questions.  Constipation: improving, colace daily, may add one serving caffeine q am for gut motility, fiber supplement daily and encourage fiber in diet, consider Maalox if no BM.  Pregnancy anemia: stable, asymptomatic, (hgb 10.8), plan to continue oral Fe (polysaccharide) and Mag Ox for constipation prevention.  DR Rivard to be updated on pt status.   [name], CNM, MSN 11/11/2019, 8:28 AM

## 2019-11-11 NOTE — Plan of Care (Signed)
  Problem: Nutrition: Goal: Adequate nutrition will be maintained Outcome: Completed/Met   Problem: Clinical Measurements: Goal: Respiratory complications will improve Outcome: Not Applicable Goal: Cardiovascular complication will be avoided Outcome: Not Applicable

## 2019-11-12 ENCOUNTER — Inpatient Hospital Stay (HOSPITAL_BASED_OUTPATIENT_CLINIC_OR_DEPARTMENT_OTHER): Payer: BC Managed Care – PPO

## 2019-11-12 DIAGNOSIS — O3432 Maternal care for cervical incompetence, second trimester: Secondary | ICD-10-CM | POA: Diagnosis not present

## 2019-11-12 DIAGNOSIS — O43192 Other malformation of placenta, second trimester: Secondary | ICD-10-CM | POA: Diagnosis not present

## 2019-11-12 DIAGNOSIS — Z3A26 26 weeks gestation of pregnancy: Secondary | ICD-10-CM | POA: Diagnosis not present

## 2019-11-12 DIAGNOSIS — E669 Obesity, unspecified: Secondary | ICD-10-CM | POA: Diagnosis not present

## 2019-11-12 DIAGNOSIS — O99212 Obesity complicating pregnancy, second trimester: Secondary | ICD-10-CM | POA: Diagnosis not present

## 2019-11-12 DIAGNOSIS — O42912 Preterm premature rupture of membranes, unspecified as to length of time between rupture and onset of labor, second trimester: Secondary | ICD-10-CM

## 2019-11-12 NOTE — Progress Notes (Signed)
Antepartum Progress Note  Patient ID: Ann Fields, female   DOB: 04-May-1996, 24 y.o.   MRN: [id number]   LOS#16: Ann Fields is a 24 y.o. G1P0000 at [redacted]w[redacted]d admitted for PPROM of unknown date and time, most likely around three weeks ago now. 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  S: Resting in bed. Denies pain/ctx/pressure. +FM. Denies any leaking of fluid in the last day. No VB. Pt denies any further questions today. Pt mood is stable, coping well with in pt care.   O: BP (!) 108/49 (BP Location: Left Arm)   Pulse (!) 115   Temp 98.1 F (36.7 C) (Oral)   Resp 17   Ht 5\' 5"  (1.651 m)   Wt 104.3 kg   LMP 05/04/2019   SpO2 98%   BMI 38.27 kg/m    PE: Physical Exam Constitutional:      General: She is awake.     Appearance: Normal appearance.  Genitourinary:     Pelvic exam was performed with patient supine.     Vulva normal.     No vulval lesion noted.     Genitourinary Comments: No LOF with valsalva  Cardiovascular:     Rate and Rhythm: Normal rate and regular rhythm.     Heart sounds: Normal heart sounds.  Pulmonary:     Effort: Pulmonary effort is normal.     Breath sounds: Normal breath sounds.  Abdominal:     Palpations: Abdomen is soft.     Tenderness: There is no abdominal tenderness.  Musculoskeletal:     Right lower leg: No edema.     Left lower leg: No edema.  Neurological:     Mental Status: She is alert.  Vitals and nursing note reviewed.    [id number]: 150 bpm, moderate variability, + accels Toco: None SVE: deferred, was 3cm on admit Presentation: (Vertex) per sono 11/05/19 AGA, AFI wnl, anterior placenta, MCI  PPROM: PTL with 3cm dilation, possible PPROM on 3/11 @ [facility] visit but questionable it occurring two weeks prior with leakage of fluids, fern-, amnisure+, pt stable now, decreased leakage, more like discharge, afebrile, S/P latency abx, UC resulted multiple species, recollection  recommended, pt already received x7 day abx, no further collection will be performed at this time. GBS Positive, plan to treat with penicillin in labor. Plan: Inpatient management until delivery if rupture of membranes is confirmed, Daily fetal testing, EFM 30 min q shift, Weekly fluid checks (Fridays), Repeat rupture of membranes check at around 28 weeks should she no longer complain of leakage of fluid and her amniotic fluid levels remain within normal limits as noted on ultrasound, Delivery at around 34 weeks or earlier should she go into spontaneous labor, should she show any signs of an intrauterine infection, or at any time fornonreassuring fetal status. Modified bed rest per MFM, distraction therapy encouraged, SCD's continuous while in bed. May have [place] ride to patio for 1 hr daily if AFI remains stable. Bathroom privileges. Pt given work note for being on bedrest.   HSV+: No prodromal s/sx, no active lesion, plan to continue daily valtrex 1000mg  PO daily for prophylactics.   Asthma: Stable, asymptomatic. Plan for Albuterol MDI PRN for wheezing.   Fetal Well-being: Stable, +FM, NST reassuring for gestational age. BMZ completed on 3/11 & 3/12, plan to give rescue dose if eminent delivery. S/P 12 H magnesium, plan to give if >32weeks and eminent delivery for neuroprotection. Last  [place] 3/19: vertex, MCI, Anterior placenta, AFI WNL, MVP 4.94 down from 5.64, EFW was 1.7 lbs, 41% on [place] 3/13. Continue QShift NST. NICU consult completed 3/11, available PRN for further patient questions.  Constipation: improving, colace daily, may add one serving caffeine q am for gut motility, fiber supplement daily and encourage fiber in diet, consider Maalox if no BM.  Pregnancy anemia: stable, asymptomatic, (hgb 10.8), plan to continue oral Fe (polysaccharide) and Mag Ox for constipation prevention.  MFM to see pt today for weekly ultrasound.   Dr. Mancel Bale to be updated on pt status.   Suzan Nailer, CNM,  MSN 11/12/2019, 12:11 PM

## 2019-11-13 LAB — TYPE AND SCREEN
ABO/RH(D): A POS
Antibody Screen: NEGATIVE

## 2019-11-13 NOTE — Progress Notes (Addendum)
Antepartum Progress Note  Patient ID: Ann Fields, female   DOB: 12-Apr-1996, 24 y.o.   MRN: [id number]   LOS#17: Ann Fields is a 24 y.o. G1P0000 at [redacted]w[redacted]d admitted for PPROM of unknown date and time, most likely around three weeks ago now. 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  S: Resting in bed. Denies pain/ctx/pressure. +FM. States that yesterday after her shower she noticed some light pink discharge when she wiped. Denies any further leaking of fluid. Pt denies any further questions today. Pt mood is stable, coping well with in pt care.   O: BP 112/71 (BP Location: Right Arm)   Pulse 98   Temp 98.2 F (36.8 C) (Oral)   Resp 18   Ht 5\' 5"  (1.651 m)   Wt 104.3 kg   LMP 05/04/2019   SpO2 100%   BMI 38.27 kg/m    PE: Physical Exam Constitutional:      General: She is awake.     Appearance: Normal appearance.  Genitourinary:     Pelvic exam was performed with patient supine.     Vulva normal.     No vulval lesion noted.     Genitourinary Comments: No LOF with valsalva  Cardiovascular:     Rate and Rhythm: Normal rate and regular rhythm.     Heart sounds: Normal heart sounds.  Pulmonary:     Effort: Pulmonary effort is normal.     Breath sounds: Normal breath sounds.  Abdominal:     Palpations: Abdomen is soft.     Tenderness: There is no abdominal tenderness.  Musculoskeletal:     Right lower leg: No edema.     Left lower leg: No edema.  Neurological:     Mental Status: She is alert.  Vitals and nursing note reviewed.    [id number]: 145 bpm, moderate variability, + accels, occasional decels Toco: None SVE: deferred, was 3cm on admit Presentation: (Vertex) per sono 11/12/19 AGA, AFI wnl, anterior placenta, MCI  PPROM: PTL with 3cm dilation, possible PPROM on 3/11 @ [facility] visit but questionable it occurring two weeks prior with leakage of fluids, fern-, amnisure+, pt stable now, decreased leakage, more  like discharge, afebrile, S/P latency abx, UC resulted multiple species, recollection recommended, pt already received x7 day abx, no further collection will be performed at this time. GBS Positive, plan to treat with penicillin in labor. Plan: Inpatient management until delivery if rupture of membranes is confirmed, Daily fetal testing, EFM 30 min q shift, Weekly fluid checks (Fridays), Repeat rupture of membranes check at around 28 weeks should she no longer complain of leakage of fluid and her amniotic fluid levels remain within normal limits as noted on ultrasound, Delivery at around 34 weeks or earlier should she go into spontaneous labor, should she show any signs of an intrauterine infection, or at any time fornonreassuring fetal status. Modified bed rest per MFM, distraction therapy encouraged, SCD's continuous while in bed. May have [place] ride to patio for 1 hr daily if AFI remains stable. Bathroom privileges. Pt given work note for being on bedrest.   HSV+: No prodromal s/sx, no active lesion, plan to continue daily valtrex 1000mg  PO daily for prophylactics.   Asthma: Stable, asymptomatic. Plan for Albuterol MDI PRN for wheezing.   Fetal Well-being: Stable, +FM, NST reassuring for gestational age. BMZ completed on 3/11 & 3/12, plan to give rescue dose if eminent delivery. S/P 12 H magnesium, plan  to give if >32weeks and eminent delivery for neuroprotection. U/S 3/19: vertex, MCI, Anterior placenta, AFI WNL, MVP 4.94 down from 5.64, EFW was 1.7 lbs, 41% on [place] 3/13. U/S on 3/26: vertex presentation, AFI 14.49cm. Continue QShift NST. NICU consult completed 3/11, available PRN for further patient questions.  Constipation: improving, colace daily, may add one serving caffeine q am for gut motility, fiber supplement daily and encourage fiber in diet, consider Maalox if no BM.  Pregnancy anemia: stable, asymptomatic, (hgb 10.8), plan to continue oral Fe (polysaccharide) and Mag Ox for constipation  prevention.  Dr. Landry Mellow to be updated on pt status.   Suzan Nailer, CNM, MSN 11/13/2019, 1:09 PM   Patient seen and examined. No evidence of chorio. Continue plan as outlined above.

## 2019-11-13 NOTE — Progress Notes (Addendum)
Called into patient's room because she saw bright red blood on tissue when she wiped after having a bowel movement. Not wearing a pad; no free-flowing blood from vagina at present. Instructed patient to notify nurse if she saw further bleeding and to get up to the bathroom within the next couple of hours to see if further bleeding is noted after using the restroom. Patient endorses active fetal movement and denies feeling contractions. 1750) No bleeding noted by patient when she got up to the bathroom just now.

## 2019-11-14 DIAGNOSIS — O42112 Preterm premature rupture of membranes, onset of labor more than 24 hours following rupture, second trimester: Secondary | ICD-10-CM | POA: Diagnosis not present

## 2019-11-14 DIAGNOSIS — O3432 Maternal care for cervical incompetence, second trimester: Secondary | ICD-10-CM | POA: Diagnosis not present

## 2019-11-14 DIAGNOSIS — Z3A26 26 weeks gestation of pregnancy: Secondary | ICD-10-CM | POA: Diagnosis not present

## 2019-11-14 NOTE — Progress Notes (Signed)
Discussed with patient need to place on monitor for a.m. NST.  Patient states she wants to wait until later.  Patient states that she feels baby moving, no vaginal bleeding.

## 2019-11-14 NOTE — Progress Notes (Addendum)
Patient ID: Ann Fields, female   DOB: 1996/06/16, 24 y.o.   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  S: Patient OOB to BR, doing well, denies ctx/LOF/VB. + FM.   O: BP 111/64 (BP Location: Left Arm)   Pulse 99   Temp 98.2 F (36.8 C) (Oral)   Resp 18   Ht 5\' 5"  (1.651 m)   Wt 104.3 kg   LMP 05/04/2019   SpO2 100%   BMI 38.27 kg/m    [id number]: 150 bpm, mod var, + small accels, no decels Toco: None SVE: deffered  A/P- A/P:24 y.o.admitted with cervical incompetence and probable PPROM Preterm labor management:modified bedrest advised Afebrile, no s/sx chorio Dating: [redacted]w[redacted]d  *PNL Needed:3T labs at 28 wks *[id number] for gestational age *PTL:Mag Sulfate x 12 H 10/28/19 BMZ course 3/11, 3/12 * Sono 3/19 - vertex, MCI, Anterior placenta, AFI WNL, MVP 4.94 down from 5.64, EFW was 1.7 lbs, 41%on [place] 3/13.  U/S on 3/26: vertex presentation, AFI 14.49cm.  *MFMconsult completed Recommendations: -Inpatient management until delivery if rupture of membranes is confirmed -Daily fetal testing, EFM 30 min q shift -Weekly fluid checks -Latency antibiotics completed.  -Magnesium sulfate for fetal neuro protection should sherequire delivery before 32 weeks -Rescue course of steroidsif >1 week since last dose if impending delivery -Repeat rupture of membranes check at around 28 weeksshould she no longer complain of leakage of fluid and heramniotic fluid levels remain within normal limits as noted onultrasound -Delivery at around 34 weeks or earlier should she go intospontaneous labor, should she show any signs of anintrauterine infection, or at any time fornonreassuring fetalstatus  * Latency antibiotics completed. UC resulted multiple species, recollection recommended, pt already received x7 day abx,  no further collection will be performed at this time.  * GBS Positive, plan to treat with penicillin in labor.  *H/O of HSV only with labs-valtrex daily *NICU consultcompleted 3/11, available PRN for further patient questions * Hx asthma - albuterol MDI PRN *Constipation-stable, BM q day or every other day on colace daily and fiber supplement. Nutrition consult completed.  * Pregnancy anemia (hgb 10.8) - continues oral Fe(polysaccharide)and Mag Ox for constipation prevention * Modified bed rest per MFM -              -PT consult completed for bed exercises, distraction therapy encouraged,  -Lovenox prophylaxis started 3/22             - WC ride to patio for 1 hr daily with stable AFI. May have BR privileges and may shower while sitting.  ROD: to be determined per presentation  LOS: 17   Update to Dr. Crist Fat, CNM, MSN 11/14/2019, 12:04 PM   Patient seen and examined. No evidence of chorio. Continue with plan as outlined above

## 2019-11-15 NOTE — Progress Notes (Addendum)
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  HD #18  S: Resting in bed. Reports feeling well. Denies cramping/pain, bleeding. Reports adequate fetal movement.   O: BP 115/63 (BP Location: Left Arm)   Pulse 84   Temp 98.1 F (36.7 C) (Oral)   Resp 18   Ht 5\' 5"  (1.651 m)   Wt 104.3 kg   LMP 05/04/2019   SpO2 98%   BMI 38.27 kg/m     FHT: 140s [id number]  A/P:24 y.o. admitted with cervical incompetence and probable PPROM  Preterm labor management: modified bedrest advised Afebrile, no s/sx chorio Dating:  [redacted]w[redacted]d  * PNL Needed:  3T labs at 28 wks * FWB:  Reassuring for gestational age * PTL:  Mag Sulfate x 12 H 10/28/19             BMZ course 3/11, 3/12 * Sono 3/26 - vertex, MCI, Anterior placenta, AFI WNL  * MFM consult completed  Recommendations: -Inpatient management until delivery if rupture of membranes is confirmed -Daily fetal testing, EFM 30 min q shift -Weekly fluid checks -Latency antibiotics completed -Magnesium sulfate for fetal neuro protection should sherequire delivery before 32 weeks -Rescue course of steroids if >1 week since last dose if impending delivery -Repeat rupture of membranes check at around 28 weeksshould she no longer complain of leakage of fluid and heramniotic fluid levels remain within normal limits as noted onultrasound -Delivery at around 34 weeks or earlier should she go intospontaneous labor, should she show any signs of anintrauterine infection, or at any time fornonreassuring fetalstatus  * H/O of HSV only with labs - valtrex daily * NICU consult completed 3/11, available PRN for further patient questions * Hx asthma - albuterol MDI PRN * Constipation - improved, BM q day or every other day on colace daily and fiber supplement. Nutrition consult completed.  * Pregnancy anemia (hgb 10.8) - started oral  Fe (polysaccharide) and Mag Ox for constipation prevention * Modified bed rest per MFM - PT consult completed for bed exercises, distraction therapy encouraged, SCD's continuous while in bed. WC ride to patio for 1 hr daily with stable AFI. May have BR privileges and may shower while sitting. *Continue Lovenox   [name], CNM  MD Addendum: I reviewed chart and agree with above findings, assessment and plan as outlined above by [facility].  Dr. Alesia Richards. 11/15/2019.

## 2019-11-16 NOTE — Progress Notes (Signed)
Patient ID: Ann Fields, female   DOB: 1995-10-04, 24 y.o.   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  S: Resting in bed, denies pain/LOF/VB. Good FM through the night.  Mood is less upbeat than previous but still in good spirits. Keeps busy with activities - reading, knitting, watching TV and communicating with family. Partner is present and supportive, usually spends the night with her. This AM he is gone to bring her outside food.   O: BP 120/65   Pulse 91   Temp 98.6 F (37 C) (Oral)   Resp 18   Ht 5\' 5"  (1.651 m)   Wt 104.3 kg   LMP 05/04/2019   SpO2 99%   BMI 38.27 kg/m    [id number]: 130 bpm, Variability: Good {> 6 bpm), Accelerations: Non-reactive but appropriate for gestational age and Decelerations: Absent Toco: None [id number], was 3 cm on admit   A/P-  24 y.o.G1P0000 at [redacted]w[redacted]d admitted with cervical incompetence and probable PPROM Preterm labor management:modified bedrest  Afebrile, no s/sx chorio  *MFMconsult completed Recommendations: -Inpatient management until delivery if rupture of membranes is confirmed -Daily fetal testing, EFM 30 min q shift -Weekly fluid checks -Latency antibiotics (completed) -Magnesium sulfate for fetal neuro protection should sherequire delivery before 32 weeks -Rescue course of steroidsif >1 week since last dose if impending delivery -Repeat rupture of membranes check at around 28 weeksshould she no longer complain of leakage of fluid and heramniotic fluid levels remain within normal limits as noted onultrasound -Delivery at around 34 weeks or earlier should she go intospontaneous labor, should she show any signs of anintrauterine infection, or at any time fornonreassuring fetalstatus  *PNL Needed:3T labs at 28 wks *[id number] for gestational  age, EFM 30 min q shift *PTL:Mag Sulfate x 12 H 10/28/19 BMZ course 3/11, 3/12 * Sono 3/26 - vertex, MCI, Anterior placenta, AFI WNL  - weekly AFI (Fridays), growth q 2 weeks (due 4/9) *H/O of HSV only with labs-valtrex daily *NICU consultcompleted 3/11, available PRN for further patient questions * Hx asthma - albuterol MDI PRN *Constipation- stable, BM q day or every other day on colace daily and fiber supplement. Nutrition consult completed.  * Pregnancy anemia (hgb 10.8) - started oral Fe(polysaccharide)and Mag Ox for constipation prevention, will rpt hgb w/ 3T labs for effectiveness  * Modified bed rest per MFM - PT consult completed for bed exercises, distraction therapy encouraged, SCD's continuouswhile in bed. WC ride to patio for 1 hr daily with stable AFI. May have BR privileges and may shower while sitting. *Continue Lovenox prophylaxis   Juliene Pina, CNM, MSN 11/16/2019, 11:03 AM

## 2019-11-17 NOTE — Progress Notes (Signed)
Unable to monitor pt in early am due to respected  Rest  Monitored pt for 1 full hour in pm  FHR 145 and no contractions noted or decelarations  noted

## 2019-11-17 NOTE — Progress Notes (Signed)
Antepartum Progress Note  Patient ID: Ann Fields, female   DOB: Aug 26, 1995, 24 y.o.   MRN: [id number]   LOS#20: Ann Fields is a 24 y.o. G1P0000 at [redacted]w[redacted]d admitted for PPROM of unknown date and time and early dilation. 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  S: Resting in bed. Denies pain or pressure but state she is feeling the baby "ball up" a few times a day. +FM. States she had some leaking of fluid last night but no bleeding. Pt denies any further questions today. Pt mood is stable, coping well with in pt care. FOB at the bedside.   O: BP 109/76 (BP Location: Left Arm)   Pulse 100   Temp 98.4 F (36.9 C) (Oral)   Resp 18   Ht 5\' 5"  (1.651 m)   Wt 104.3 kg   LMP 05/04/2019   SpO2 98%   BMI 38.27 kg/m    PE: Physical Exam Constitutional:      General: She is awake.     Appearance: Normal appearance.  Genitourinary:     Pelvic exam was performed with patient supine.     Vulva normal.     No vulval lesion noted.     Genitourinary Comments: No LOF with valsalva  Cardiovascular:     Rate and Rhythm: Normal rate and regular rhythm.     Heart sounds: Normal heart sounds.  Pulmonary:     Effort: Pulmonary effort is normal.     Breath sounds: Normal breath sounds.  Abdominal:     Palpations: Abdomen is soft.     Tenderness: There is no abdominal tenderness.  Musculoskeletal:     Right lower leg: No edema.     Left lower leg: No edema.  Neurological:     Mental Status: She is alert.  Vitals and nursing note reviewed.    [id number]: 150 bpm, moderate variability, + accels  Toco: None SVE: deferred, was 3cm on admit Presentation: (Vertex) per sono 11/05/19 AGA, AFI wnl, anterior placenta, MCI  PPROM: PTL with 3cm dilation, possible PPROM on 3/11 @ [facility] visit but questionable it occurring two weeks prior with leakage of fluids, fern-, amnisure+, pt stable now, decreased leakage, more like discharge, afebrile,  S/P latency abx, UC resulted multiple species, recollection recommended, pt already received x7 day abx, no further collection will be performed at this time. GBS Positive, plan to treat with penicillin in labor.   HSV+: No prodromal s/sx, no active lesion, plan to continue daily valtrex 1000mg  PO daily for prophylactics.   Asthma: Stable, asymptomatic. Plan for Albuterol MDI PRN for wheezing.   Fetal Well-being: Stable, +FM, NST reassuring for gestational age. BMZ completed on 3/11 & 3/12, plan to give rescue dose if eminent delivery. S/P 12 H magnesium, plan to give if >32weeks and eminent delivery for neuroprotection. Continue QShift NST. NICU consult completed 3/11, available PRN for further patient questions. Last U/S 3/26 - vertex, AFI 14.49, will have repeat sono this Friday.   Constipation: improving, colace daily, may add one serving caffeine q am for gut motility, fiber supplement daily and encourage fiber in diet, consider Maalox if no BM.  Pregnancy anemia: stable, asymptomatic, (hgb 10.8), plan to continue oral Fe (polysaccharide) and Mag Ox for constipation prevention.  VTE prophylaxis: Continue Lovenox  Recommendations per MFM: Inpatient management until delivery if rupture of membranes is confirmed, Daily fetal testing, EFM 30 min q shift, Weekly fluid checks (Fridays),  Repeat rupture of membranes check at around 28 weeks should she no longer complain of leakage of fluid and her amniotic fluid levels remain within normal limits as noted on ultrasound, Delivery at around 34 weeks or earlier should she go into spontaneous labor, should she show any signs of an intrauterine infection, or at any time fornonreassuring fetal status. Modified bed rest per MFM, distraction therapy encouraged, SCD's continuous while in bed. May have [place] ride to patio for 1 hr daily if AFI remains stable. Bathroom privileges. Pt given work note for being on bedrest.   Dr. Alesia Richards to be updated on pt status.    Suzan Nailer, CNM, MSN 11/17/2019, 10:32 AM

## 2019-11-18 LAB — TYPE AND SCREEN
ABO/RH(D): A POS
Antibody Screen: NEGATIVE

## 2019-11-18 NOTE — Progress Notes (Signed)
Antepartum Progress Note  Patient ID: Ann Fields, female   DOB: 1996/05/16, 24 y.o.   MRN: [id number]   LOS#21: Ann Fields is a 24 y.o. G1P0000 at [redacted]w[redacted]d admitted for PPROM of unknown date and time, most likely around three weeks ago now. 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  S: Resting in bed. Denies pain/ctx/pressure/vag bleeding. +FM. Reports no fluid loss over the night. Pt mood is stable, coping well with in pt care.   O: BP 110/61 (BP Location: Left Arm)   Pulse 87   Temp 98 F (36.7 C) (Oral)   Resp 18   Ht 5\' 5"  (1.651 m)   Wt 104.3 kg   LMP 05/04/2019   SpO2 98%   BMI 38.27 kg/m    PE: Physical Exam Constitutional:      General: She is awake.     Appearance: Normal appearance.  Genitourinary:     Pelvic exam was performed with patient supine.     Vulva normal.     No vulval lesion noted.     Genitourinary Comments: No LOF with valsalva  Cardiovascular:     Rate and Rhythm: Normal rate and regular rhythm.     Heart sounds: Normal heart sounds.  Pulmonary:     Effort: Pulmonary effort is normal.     Breath sounds: Normal breath sounds.  Abdominal:     Palpations: Abdomen is soft.     Tenderness: There is no abdominal tenderness.  Musculoskeletal:     Right lower leg: No edema.     Left lower leg: No edema.  Neurological:     Mental Status: She is alert.  Vitals and nursing note reviewed.    [id number]: 150 bpm, moderate variability, + accels, - decels on NST,@0030 , today.   Toco: None SVE: deferred, was 3cm on admit Presentation: (Vertex) per sono 11/05/19 AGA, AFI wnl, anterior placenta, MCI  A/P- LOS#21: Ann Fields is a 24 y.o. G1P0000 at [redacted]w[redacted]d admitted for PPROM of unknown date and time, currently most likely approx. 3/4 weeks ago. 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  PPROM: PTL with 3cm dilation, possible PPROM on 3/11 @ [facility] visit but questionable it occurring two weeks prior with leakage of fluids, fern-, amnisure+, pt stable now, decreased leakage, more like discharge, afebrile, S/P latency abx, UC resulted multiple species, recollection recommended, pt already received x7 day abx, no further collection will be performed at this time. GBS Positive, plan to treat with penicillin in labor. Plan: Inpatient management until delivery if rupture of membranes is confirmed, Daily fetal testing, EFM 30 min q shift, Weekly fluid checks (Fridays), Repeat rupture of membranes check at around 28 weeks should she no longer complain of leakage of fluid and her amniotic fluid levels remain within normal limits as noted on ultrasound, Delivery at around 34 weeks or earlier should she go into spontaneous labor, should she show any signs of an intrauterine infection, or at any time fornonreassuring fetal status. Modified bed rest per MFM, distraction therapy encouraged, SCD's continuous while in bed. May have [place] ride to patio for 1 hr daily if AFI remains stable. Bathroom privileges. Work note for being on bedrest given.   HSV+: No prodromal s/sx, no active lesion, plan to continue daily valtrex 1000mg  PO daily for prophylactics.   Asthma: Stable, asymptomatic. Plan for Albuterol MDI PRN for wheezing.  Fetal Well-being: Stable, +FM, NST reassuring for gestational age. BMZ completed on 3/11 & 3/12, plan to give rescue dose if eminent delivery. S/P 12 H magnesium, plan to give if >32weeks and eminent delivery for neuroprotection. Last [place] 3/19: vertex, MCI, Anterior placenta, AFI WNL, MVP 4.94 down from 5.64, EFW was 1.7 lbs, 41% on [place] 3/13. Continue QShift NST. NICU consult completed 3/11, available PRN for further patient questions.  Constipation: improving, colace daily, may add one serving caffeine q am for gut motility, fiber supplement daily and encourage fiber in diet, consider Maalox if no BM.  Pregnancy anemia: stable, asymptomatic,  (hgb 10.8), plan to continue oral Fe (polysaccharide) and Mag Ox for constipation prevention.  VTE prophylaxis: Continue Lovenox  DR Alwyn Pea  to be updated on pt status.   [name], CNM, MSN 11/18/2019, 10:24 AM

## 2019-11-19 ENCOUNTER — Inpatient Hospital Stay (HOSPITAL_BASED_OUTPATIENT_CLINIC_OR_DEPARTMENT_OTHER): Payer: BC Managed Care – PPO

## 2019-11-19 DIAGNOSIS — O42912 Preterm premature rupture of membranes, unspecified as to length of time between rupture and onset of labor, second trimester: Secondary | ICD-10-CM | POA: Diagnosis not present

## 2019-11-19 DIAGNOSIS — O3432 Maternal care for cervical incompetence, second trimester: Secondary | ICD-10-CM | POA: Diagnosis not present

## 2019-11-19 DIAGNOSIS — E669 Obesity, unspecified: Secondary | ICD-10-CM

## 2019-11-19 DIAGNOSIS — Z3A27 27 weeks gestation of pregnancy: Secondary | ICD-10-CM | POA: Diagnosis not present

## 2019-11-19 DIAGNOSIS — O43192 Other malformation of placenta, second trimester: Secondary | ICD-10-CM | POA: Diagnosis not present

## 2019-11-19 DIAGNOSIS — O99212 Obesity complicating pregnancy, second trimester: Secondary | ICD-10-CM

## 2019-11-19 NOTE — Progress Notes (Addendum)
Antepartum Progress Note  Patient ID: Ann Fields, female   DOB: September 09, 1995, 24 y.o.   MRN: [id number]   LOS#22: Ann Fields is a 24 y.o. G1P0000 at [redacted]w[redacted]d admitted for PPROM of unknown date and time, most likely around three weeks ago now. 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  S: Resting in bed. Denies pain/ctx/pressure/vag bleeding. +FM. Reports no fluid loss over the night. Pt mood is stable, coping well with in pt care.   O: BP (!) 107/50 (BP Location: Left Arm) Comment: nurse notified  Pulse (!) 107   Temp 98.7 F (37.1 C) (Axillary)   Resp 16   Ht 5\' 5"  (1.651 m)   Wt 104.3 kg   LMP 05/04/2019   SpO2 100%   BMI 38.27 kg/m    PE: Physical Exam Constitutional:      General: She is awake.     Appearance: Normal appearance.  Genitourinary:     Pelvic exam was performed with patient supine.     Vulva normal.     No vulval lesion noted.     Genitourinary Comments: No LOF with valsalva  Cardiovascular:     Rate and Rhythm: Normal rate and regular rhythm.     Heart sounds: Normal heart sounds.  Pulmonary:     Effort: Pulmonary effort is normal.     Breath sounds: Normal breath sounds.  Abdominal:     Palpations: Abdomen is soft.     Tenderness: There is no abdominal tenderness.  Musculoskeletal:     Right lower leg: No edema.     Left lower leg: No edema.  Neurological:     Mental Status: She is alert.  Vitals and nursing note reviewed.    [id number]: 150 bpm, moderate variability, + accels, - decels on NST,@0030 , today.   Toco: None SVE: deferred, was 3cm on admit Presentation: (Vertex) per sono 11/05/19 AGA, AFI wnl, anterior placenta, MCI  A/P- LOS#22: Ann Fields is a 24 y.o. G1P0000 at [redacted]w[redacted]d admitted for PPROM of unknown date and time, currently most likely approx. 3/4 weeks ago. 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  PPROM: PTL with 3cm  dilation, possible PPROM on 3/11 @ [facility] visit but questionable it occurring two weeks prior with leakage of fluids, fern-, amnisure+, pt stable now, decreased leakage, afebrile, S/P latency abx, UC resulted multiple species, recollection recommended, pt already received x7 day abx, no further collection will be performed at this time. GBS Positive, plan to treat with penicillin in labor. Plan: Inpatient management until delivery if rupture of membranes is confirmed, Daily fetal testing, EFM 30 min q shift, Weekly fluid checks (Fridays), Repeat rupture of membranes check at around 28 weeks should she no longer complain of leakage of fluid and her amniotic fluid levels remain within normal limits as noted on ultrasound, Delivery at around 34 weeks or earlier should she go into spontaneous labor, should she show any signs of an intrauterine infection, or at any time fornonreassuring fetal status. Modified bed rest per MFM, distraction therapy encouraged, SCD's continuous while in bed. May have [place] ride to patio for 1 hr daily if AFI remains stable. Bathroom privileges. Work note for being on bedrest given.   HSV+: No prodromal s/sx, no active lesion, plan to continue daily valtrex 1000mg  PO daily for prophylactics.   Asthma: Stable, asymptomatic. Plan for Albuterol MDI PRN for wheezing.  Fetal Well-being: Stable, +FM, NST reassuring for gestational age. BMZ completed on 3/11 & 3/12, plan to give rescue dose if eminent delivery. S/P 12 H magnesium, plan to give if >32weeks and eminent delivery for neuroprotection. Last [place] 4/2, cephalic, anterior placenta, marginal cord insertion, AFI 7.91.  [place] 3/19: vertex, MCI, Anterior placenta, AFI WNL, MVP 4.94 down from 5.64, EFW was 1.7 lbs, 41% on [place] 3/13. Continue QShift NST. NICU consult completed 3/11, available PRN for further patient questions.  Constipation: improving, colace daily, may add one serving caffeine q am for gut motility, fiber supplement daily and  encourage fiber in diet, consider Maalox if no BM.  Pregnancy anemia: stable, asymptomatic, (hgb 10.8), plan to continue oral Fe (polysaccharide) and Mag Ox for constipation prevention.  VTE prophylaxis: Continue Lovenox  DR Mancel Bale  to be updated on pt status.   [name], CNM, MSN 11/19/2019, 9:51 AM

## 2019-11-20 NOTE — Progress Notes (Addendum)
Patient ID: Ann Fields, female   DOB: 1996-03-17, 24 y.o.   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   O: BP (!) 102/54   Pulse 97   Temp 98.2 F (36.8 C) (Oral)   Resp 16   Ht 5\' 5"  (1.651 m)   Wt 104.3 kg   LMP 05/04/2019   SpO2 100%   BMI 38.27 kg/m    [id number]: 150 bpm, Variability: Good {> 6 bpm), Accelerations: Non-reactive but appropriate for gestational age and Decelerations: Absent Toco: None SVE: deferred  A/P- 24 y.o. 24 y.o.G1P0000 at [redacted]w[redacted]d admitted with cervical incompetence and probable PPROM  Preterm labor management:modified bedrest  Afebrile, no s/sx chorio  PPROM:  - PTL with 3cm dilation, possible PPROM on 3/11  - S/P latency abx, UC resulted multiple species, recollection recommended, pt already received x7 day abx, no further collection will be performed at this time. - - GBS Positive, plan to treat with penicillin in labor. - Inpatient management until delivery if rupture of membranes is confirmed, Daily fetal testing, EFM 30 min q shift, Weekly fluid checks (Fridays), Repeat rupture of membranes check at around 28 weeksshould she no longer complain of leakage of fluid and heramniotic fluid levels remain within normal limits as noted onultrasound, Delivery at around 34 weeks or earlier should she go intospontaneous labor, should she show any signs of anintrauterine infection, or at any time fornonreassuring fetalstatus. Modified bed rest per MFM, distraction therapy encouraged, SCD's continuous while in bed. May have [place] ride to patio for 1 hr daily if AFI remains stable. Bathroom privileges. Work note for being on bedrest given.   HSV+: No prodromal s/sx, no active lesion, plan to continue daily valtrex 1000mg  PO daily for prophylactics.    Asthma: Stable, asymptomatic. Plan for Albuterol MDI PRN for wheezing.   Fetal Well-being: Stable, +FM, NST reassuring for gestational age.  - BMZ completed on 3/11 & 3/12, plan to give rescue dose if eminent delivery. - S/P 12 H magnesium, plan to give if >32weeks and eminent delivery for neuroprotection.  - Last [place] 4/2, cephalic, anterior placenta, marginal cord insertion, AFI 7.91.   - [place] 3/19: vertex, MCI, Anterior placenta, AFI WNL, MVP 4.94 down from 5.64,  - growth [place] 3/13 EFW 1.7 lbs, 41%.  - Continue QShift NST.  - NICU consult completed 3/11, available PRN for further patient questions.  Constipation: stable on colace daily, may add one serving caffeine q am for gut motility, fiber supplement daily and encourage fiber in diet, Maalox if no BM.  Pregnancy anemia: stable, asymptomatic, (hgb 10.8), plan to continue oral Fe (polysaccharide) and Mag Ox for constipation prevention.  VTE prophylaxis:Continue Lovenox  ROD:  to be determined per presentation  Update to Dr. Katheran Awe, CNM, MSN 11/20/2019, 11:52 AM

## 2019-11-20 NOTE — Plan of Care (Signed)
  Problem: Clinical Measurements: Goal: Complications related to the disease process, condition or treatment will be avoided or minimized Outcome: Progressing   

## 2019-11-21 LAB — TYPE AND SCREEN
ABO/RH(D): A POS
Antibody Screen: NEGATIVE

## 2019-11-21 NOTE — Progress Notes (Addendum)
Antepartum Progress Note  Patient ID: Ann Fields, female   DOB: [birth year], 24 y.o.   MRN: [id number]   LOS#24: Ann Fields is a 24 y.o. G1P0000 at [redacted]w[redacted]d admitted for PPROM of unknown date and time, most likely around three weeks ago now. 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  S: Resting in bed. Denies pain/ctx/pressure/vag bleeding. +FM. Reports no fluid loss over the night. Pt mood is stable, coping well with in pt care.   O: BP 105/60 (BP Location: Left Arm)   Pulse 97   Temp 98.4 F (36.9 C) (Oral)   Resp 17   Ht 5\' 5"  (1.651 m)   Wt 104.3 kg   LMP 05/04/2019   SpO2 99%   BMI 38.27 kg/m    PE: Physical Exam Constitutional:      General: She is awake.     Appearance: Normal appearance.  Genitourinary:     Pelvic exam was performed with patient supine.     Vulva normal.     No vulval lesion noted.     Genitourinary Comments: No LOF with valsalva  Cardiovascular:     Rate and Rhythm: Normal rate and regular rhythm.     Heart sounds: Normal heart sounds.  Pulmonary:     Effort: Pulmonary effort is normal.     Breath sounds: Normal breath sounds.  Abdominal:     Palpations: Abdomen is soft.     Tenderness: There is no abdominal tenderness.  Musculoskeletal:     Right lower leg: No edema.     Left lower leg: No edema.  Neurological:     Mental Status: She is alert.  Vitals and nursing note reviewed.    [id number]: 150 bpm, moderate variability, + accels, - decels on NST,@0030 , today.   Toco: None SVE: deferred, was 3cm on admit Presentation: (Vertex) per sono 11/05/19 AGA, AFI wnl, anterior placenta, MCI  A/P- LOS#24: Ann Fields is a 24 y.o. G1P0000 at [redacted]w[redacted]d admitted for PPROM of unknown date and time, currently most likely approx. 3/4 weeks ago. 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  PPROM: PTL with 3cm dilation, possible PPROM on 3/11 @  [facility] visit but questionable it occurring two weeks prior with leakage of fluids, fern-, amnisure+, pt stable now, decreased leakage, afebrile, S/P latency abx, UC resulted multiple species, recollection recommended, pt already received x7 day abx, no further collection will be performed at this time. GBS Positive, plan to treat with penicillin in labor. Plan: Inpatient management until delivery if rupture of membranes is confirmed, Daily fetal testing, EFM 30 min q shift, Weekly fluid checks (Fridays), Repeat rupture of membranes check at around 28 weeks should she no longer complain of leakage of fluid and her amniotic fluid levels remain within normal limits as noted on ultrasound, Delivery at around 34 weeks or earlier should she go into spontaneous labor, should she show any signs of an intrauterine infection, or at any time fornonreassuring fetal status. Modified bed rest per MFM, distraction therapy encouraged, SCD's continuous while in bed. May have [place] ride to patio for 1 hr daily if AFI remains stable. Bathroom privileges. Work note for being on bedrest given.   HSV+: No prodromal s/sx, no active lesion, plan to continue daily valtrex 1000mg  PO daily for prophylactics.   Asthma: Stable, asymptomatic. Plan for Albuterol MDI PRN for wheezing.   Fetal Well-being: Stable, +  FM, NST reassuring for gestational age. BMZ completed on 3/11 & 3/12, plan to give rescue dose if eminent delivery. S/P 12 H magnesium, plan to give if >32weeks and eminent delivery for neuroprotection. Last [place] 4/2, cephalic, anterior placenta, marginal cord insertion, AFI 7.91.  [place] 3/19: vertex, MCI, Anterior placenta, AFI WNL, MVP 4.94 down from 5.64, EFW was 1.7 lbs, 41% on [place] 3/13. Continue QShift NST. NICU consult completed 3/11, available PRN for further patient questions.  Constipation: improving, colace daily, may add one serving caffeine q am for gut motility, fiber supplement daily and encourage fiber in diet, consider  Maalox if no BM.  Pregnancy anemia: stable, asymptomatic, (hgb 10.8), plan to continue oral Fe (polysaccharide) and Mag Ox for constipation prevention.  VTE prophylaxis: Continue Lovenox  DR Mancel Bale  to be updated on pt status.   Ann Fields, CNM, MSN 11/21/2019, 1:56 PM

## 2019-11-21 NOTE — Discharge Instructions (Signed)
Premature Rupture and Preterm Premature Rupture of Membranes  Rupture of membranes is when the membranes (amniotic sac) that hold your baby break open. This is commonly referred to as your "water breaking." If your water breaks before labor starts (prematurely), it is called premature rupture of membranes (PROM). If PROM occurs before 37 weeks of pregnancy, it is called preterm premature rupture of membranes (PPROM). Because the amniotic sac keeps infection out and performs other important functions, having the amniotic sac rupture before 37 weeks of pregnancy can lead to serious problems. It requires immediate attention from a health care provider. What are the causes? When PROM occurs at 37 weeks of pregnancy or later, it is usually caused by natural weakening of the membranes and friction caused by contractions. PPROM is usually caused by infection. In many cases, the cause is not known. What increases the risk of PPROM? The following factors may make you more likely to have PPROM:  Infection.  Having had PPROM in a previous pregnancy.  Short cervical length.  Bleeding during the second or third trimester.  Low BMI, which is an estimate of body fat.  Smoking.  Using drugs.  Low socioeconomic status. What problems can be caused by PROM and PPROM? This condition creates health dangers for the mother and the baby. These include:  Delivering a premature baby.  Getting a serious infection of the placental tissues (chorioamnionitis).  Early detachment of the placenta from the uterus (placental abruption).  Compression of the umbilical cord.  Developing a serious infection after delivery. What are the signs of PROM and PPROM? Signs of this condition include:  A sudden gush or slow leaking of fluid from the vagina.  Constant wet underwear. Sometimes, women mistake the leaking or wetness for urine, especially if the leak is slow and not a gush of fluid. If there is constant  leaking or if your underwear continues to get wet, your membranes have likely ruptured. What should I do if I think my membranes have ruptured?  Call your health care provider right away.  You will need to go to the hospital immediately to be checked by a health care provider. What happens if I am diagnosed with PROM or PPROM? Once you arrive at the hospital, you will have tests done. A cervical exam will be done using a lubricated instrument (speculum) to check whether the cervix has softened or started to open (dilate).  If you are diagnosed with PROM, your labor may be started for you (you may be induced) within 24 hours if you are not having contractions.  If you are diagnosed with PPROM and you are not having contractions, you may be induced depending on your trimester. If you have PPROM:  You and your baby will be monitored closely for signs of infection or other complications.  You may be given: ? An antibiotic medicine to lower the chances of developing an infection. ? A steroid medicine to help mature the baby's lungs more quickly. ? A medicine to help prevent cerebral palsy in your baby. ? A medicine to stop preterm labor.  You may be ordered to be on bed rest at home or in the hospital.  You may be induced if complications occur for you or the baby. Your treatment will depend on many factors, such as how many weeks you have been pregnant (how far along you are), the development of the baby, and other complications that may occur. This information is not intended to replace advice given to  you by your health care provider. Make sure you discuss any questions you have with your health care provider. Document Revised: 11/27/2018 Document Reviewed: 03/11/2016 Elsevier Patient Education  [place].

## 2019-11-22 MED ORDER — LACTATED RINGERS IV BOLUS
500.0000 mL | Freq: Once | INTRAVENOUS | Status: AC
  Administered 2019-11-22: 500 mL via INTRAVENOUS

## 2019-11-22 NOTE — Progress Notes (Signed)
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  HD #25  S: Resting in bed. Reports feeling well. Denies cramping/pain, bleeding. Reports adequate fetal movement. Optimistic about approaching 28.0 week reevaluation  O: BP 118/66 (BP Location: Left Arm)   Pulse 93   Temp 98.3 F (36.8 C) (Oral)   Resp 20   Ht 5\' 5"  (1.651 m)   Wt 104.3 kg   LMP 05/04/2019   SpO2 100%   BMI 38.27 kg/m     FHT: 140s [id number]  A/P:24 y.o. admitted with cervical incompetence and probable PPROM  Preterm labor management: modified bedrest advised Afebrile, no s/sx chorio Dating:  [redacted]w[redacted]d  * PNL Needed:  3T labs at 28 wks * FWB:  Reassuring for gestational age * PTL:  Mag Sulfate x 12 H 10/28/19             BMZ course 3/11, 3/12 * Sono vertex, MCI, Anterior placenta, AFI WNL  * MFM consult completed  Recommendations: -Inpatient management until delivery if rupture of membranes is confirmed -Daily fetal testing, EFM 30 min q shift -Weekly fluid checks -Latency antibiotics completed -Magnesium sulfate for fetal neuro protection should sherequire delivery before 32 weeks -Rescue course of steroids if >1 week since last dose if impending delivery -Repeat rupture of membranes check at around 28 weeksshould she no longer complain of leakage of fluid and heramniotic fluid levels remain within normal limits as noted onultrasound -Delivery at around 34 weeks or earlier should she go intospontaneous labor, should she show any signs of anintrauterine infection, or at any time fornonreassuring fetalstatus  * H/O of HSV only with labs - valtrex daily * NICU consult completed 3/11, available PRN for further patient questions * Hx asthma - albuterol MDI PRN * Constipation - improved, BM q day or every other day on colace daily and fiber supplement. Nutrition consult completed.  *  Pregnancy anemia (hgb 10.8) - started oral Fe (polysaccharide) and Mag Ox for constipation prevention * Modified bed rest per MFM - PT consult completed for bed exercises, distraction therapy encouraged, SCD's continuous while in bed. WC ride to patio for 1 hr daily with stable AFI. May have BR privileges and may shower while sitting. *Continue Lovenox   [name], CNM

## 2019-11-22 NOTE — Progress Notes (Signed)
[facility] called for remote assessment of fetal monitoring strip. FHR 145 w/ moderate variability and 15X15 accelerations. One variable decel @0036  followed by a late deceleration @0044  and two other decels without Toco tracing contractions. Patient verbalized feeling contractions during this period. Contractions not felt by this RN with palpation. Patient position changed from supine to right lateral and Midwife called. Orders received to administer 550mL LR fluid bolus and continue monitoring patient.   Patient removed from fetal monitoring after contractions stopped, FHR continued with moderate variability and 10X10 accelerations.

## 2019-11-23 NOTE — Progress Notes (Signed)
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  HD #26  S: Resting in bed. Reports feeling well. Denies cramping/pain, bleeding. Reports adequate fetal movement. Optimistic about approaching 28.0 week reevaluation  O: BP 107/73 (BP Location: Left Arm)   Pulse 97   Temp 98.7 F (37.1 C) (Oral)   Resp 19   Ht 5\' 5"  (1.651 m)   Wt 104.3 kg   LMP 05/04/2019   SpO2 98%   BMI 38.27 kg/m     FHT: 140s [id number]  A/P:24 y.o. admitted with cervical incompetence and probable PPROM  Preterm labor management: modified bedrest advised Afebrile, no s/sx chorio Dating:  [redacted]w[redacted]d * PNL Needed:  3T labs at 28 wks * FWB:  Reassuring for gestational age * PTL:  Mag Sulfate x 12 H 10/28/19             BMZ course 3/11, 3/12 * Sono vertex, MCI, Anterior placenta, AFI WNL  * MFM consult completed  Recommendations: -Inpatient management until delivery if rupture of membranes is confirmed -Daily fetal testing, EFM 30 min q shift -Weekly fluid checks -Latency antibiotics completed -Magnesium sulfate for fetal neuro protection should sherequire delivery before 32 weeks -Rescue course of steroids if >1 week since last dose if impending delivery -Repeat rupture of membranes check at around 28 weeksshould she no longer complain of leakage of fluid and heramniotic fluid levels remain within normal limits as noted onultrasound -Delivery at around 34 weeks or earlier should she go intospontaneous labor, should she show any signs of anintrauterine infection, or at any time fornonreassuring fetalstatus  * H/O of HSV only with labs - valtrex daily * NICU consult completed 3/11, available PRN for further patient questions * Hx asthma - albuterol MDI PRN * Constipation - improved, BM q day or every other day on colace daily and fiber supplement. Nutrition consult completed.  *  Pregnancy anemia (hgb 10.8) - started oral Fe (polysaccharide) and Mag Ox for constipation prevention * Modified bed rest per MFM - PT consult completed for bed exercises, distraction therapy encouraged, SCD's continuous while in bed. WC ride to patio for 1 hr daily with stable AFI. May have BR privileges and may shower while sitting. *Continue Lovenox   [name], CNM

## 2019-11-23 NOTE — Plan of Care (Signed)
  Problem: Clinical Measurements: Goal: Complications related to the disease process, condition or treatment will be avoided or minimized Outcome: Progressing   

## 2019-11-24 ENCOUNTER — Encounter (HOSPITAL_COMMUNITY): Payer: Self-pay | Admitting: Obstetrics and Gynecology

## 2019-11-24 LAB — TYPE AND SCREEN
ABO/RH(D): A POS
Antibody Screen: NEGATIVE

## 2019-11-24 NOTE — Progress Notes (Signed)
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  HD #27  S: Resting in bed. Reports feeling well. Denies cramping/pain or bleeding. States she has some leaking of discharge/fluid at night when she uses the restroom. Reports adequate fetal movement. Is feeling very hopeful that she may be discharged home after evaluation on Friday.   O: BP 107/78 (BP Location: Left Arm)   Pulse 98   Temp 98 F (36.7 C) (Oral)   Resp 17   Ht 5\' 5"  (1.651 m)   Wt 104.3 kg   LMP 05/04/2019   SpO2 100%   BMI 38.27 kg/m    FHT: 140s, moderate variability, + accels, no decels, appropriate for gestational age SVE: deferred  A/P: 24 y.o. admitted with cervical incompetence and probable PPROM  Preterm labor management: modified bedrest advised Afebrile, no s/sx chorio Dating:  [redacted]w[redacted]d * PNL Needed:  3T labs at 28 wks * FWB:  Reassuring for gestational age * PTL:  Mag Sulfate x 12 H 10/28/19             BMZ course 3/11, 3/12 * Sono vertex, MCI, Anterior placenta, AFI WNL, will have sono this Friday (11/26/2019)  * MFM consult completed  Recommendations: -Inpatient management until delivery if rupture of membranes is confirmed -Daily fetal testing, EFM 30 min q shift -Weekly fluid checks -Latency antibiotics completed -Magnesium sulfate for fetal neuro protection should sherequire delivery before 32 weeks -Rescue course of steroids if >1 week since last dose if impending delivery -Repeat rupture of membranes check at around 28 weeksshould she no longer complain of leakage of fluid and heramniotic fluid levels remain within normal limits as noted onultrasound -Delivery at around 34 weeks or earlier should she go intospontaneous labor, should she show any signs of anintrauterine infection, or at any time fornonreassuring fetalstatus  * H/O of HSV only with labs - valtrex daily *  NICU consult completed 3/11, available PRN for further patient questions * Hx asthma - albuterol MDI PRN * Constipation - improved, BM q day or every other day on colace daily and fiber supplement. Nutrition consult completed.  * Pregnancy anemia (hgb 10.8) - started oral Fe (polysaccharide) and Mag Ox for constipation prevention * Modified bed rest per MFM - PT consult completed for bed exercises, distraction therapy encouraged, SCD's continuous while in bed. WC ride to patio for 1 hr daily with stable AFI. May have BR privileges and may shower while sitting. *Continue Lovenox   Gavin Potters, CNM, MSN

## 2019-11-25 NOTE — Progress Notes (Addendum)
Antepartum Progress Note  Patient ID: Ann Fields, female   DOB: August 16, 1996, 24 y.o.   MRN: [id number]   LOS#28: Ann Fields is a 24 y.o. G1P0000 at [redacted]w[redacted]d admitted for PPROM of unknown date and time, most likely around three weeks ago now. 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  S: Resting in bed. Denies pain/ctx/pressure/vag bleeding. +FM. Reports no fluid loss over the night. Pt mood is stable, coping well with in pt care. Excitted about redoing her SROM workup tomorrow.   O: BP 119/61 (BP Location: Left Arm)   Pulse 97   Temp 98.5 F (36.9 C) (Oral)   Resp 16   Ht 5\' 5"  (1.651 m)   Wt 104.3 kg   LMP 05/04/2019   SpO2 99%   BMI 38.27 kg/m    PE: Physical Exam Constitutional:      General: She is awake.     Appearance: Normal appearance.  Genitourinary:     Pelvic exam was performed with patient supine.     Vulva normal.     No vulval lesion noted.     Genitourinary Comments: No LOF with valsalva  Cardiovascular:     Rate and Rhythm: Normal rate and regular rhythm.     Heart sounds: Normal heart sounds.  Pulmonary:     Effort: Pulmonary effort is normal.     Breath sounds: Normal breath sounds.  Abdominal:     Palpations: Abdomen is soft.     Tenderness: There is no abdominal tenderness.  Musculoskeletal:     Right lower leg: No edema.     Left lower leg: No edema.  Neurological:     Mental Status: She is alert.  Vitals and nursing note reviewed.    [id number]: 150 bpm, moderate variability, + accels 10X10, - decels on NST,@0030 , today. Reassuring for gestational age.   Toco: None SVE: deferred, was 3cm on admit Presentation: (Vertex) per sono 11/05/19 AGA, AFI wnl, anterior placenta, MCI  A/P- LOS#28: Ann Fields is a 24 y.o. G1P0000 at [redacted]w[redacted]d admitted for PPROM of unknown date and time, currently most likely approx. 3/4 weeks ago. Hx asthma, endometriosis, seizures NOS age 64, none as adult, HSV2  seropositive no hx outbreaks.  PPROM:  Stable, asymptomatic,, PTL with 3cm dilation, possible PPROM on 3/11 @ [facility] ROB visit but questionable it occurring two weeks prior with leakage of fluids, fern-, amnisure+, pt stable now, decreased leakage, afebrile, S/P latency abx, UC resulted multiple species, recollection recommended, pt already received x7 day abx, no further collection will be performed at this time. GBS Positive, plan to treat with penicillin in labor. Plan: Inpatient management until delivery if rupture of membranes is confirmed, Daily fetal testing, EFM 30 min q shift, Weekly fluid checks (Fridays), Repeat rupture of membranes check at around 28 weeks should she no longer complain of leakage of fluid and her amniotic fluid levels remain within normal limits as noted on ultrasound, Delivery at around 34 weeks or earlier should she go into spontaneous labor, should she show any signs of an intrauterine infection, or at any time fornonreassuring fetal status. Modified bed rest per MFM, distraction therapy encouraged, SCD's continuous while in bed. May have [place] ride to patio for 1 hr daily if AFI remains stable. Bathroom privileges. Work note for being on bedrest given.   HSV+:  Stable, asymptomatic, No prodromal s/sx, no active lesion, plan to continue daily valtrex 1000mg  PO daily for  prophylactics.   Asthma: Stable, asymptomatic. Plan for Albuterol MDI PRN for wheezing.   Fetal Well-being: Stable, +FM, NST reassuring for gestational age. BMZ completed on 3/11 & 3/12, plan to give rescue dose if eminent delivery. S/P 12 H magnesium, plan to give if >32weeks and eminent delivery for neuroprotection. Last [place] 4/2, cephalic, anterior placenta, marginal cord insertion, AFI 7.91.  [place] 3/19: vertex, MCI, Anterior placenta, AFI WNL, MVP 4.94 down from 5.64, EFW was 1.7 lbs, 41% on [place] 3/13. Continue QShift NST. NICU consult completed 3/11, available PRN for further patient questions.  Constipation:  Stable, asymptomatic,, colace daily, may add one serving caffeine q am for gut motility, fiber supplement daily and encourage fiber in diet, consider Maalox if no BM.  Pregnancy anemia: stable, asymptomatic, (hgb 10.8), plan to continue oral Fe (polysaccharide) and Mag Ox for constipation prevention.  VTE prophylaxis: Stable, asymptomatic,Continue Lovenox  DR Dillard  to be updated on pt status.   [name], CNM, MSN 11/25/2019, 11:10 AM

## 2019-11-26 ENCOUNTER — Inpatient Hospital Stay (HOSPITAL_BASED_OUTPATIENT_CLINIC_OR_DEPARTMENT_OTHER): Payer: BC Managed Care – PPO

## 2019-11-26 DIAGNOSIS — O42913 Preterm premature rupture of membranes, unspecified as to length of time between rupture and onset of labor, third trimester: Secondary | ICD-10-CM

## 2019-11-26 DIAGNOSIS — Z3A28 28 weeks gestation of pregnancy: Secondary | ICD-10-CM

## 2019-11-26 DIAGNOSIS — E669 Obesity, unspecified: Secondary | ICD-10-CM

## 2019-11-26 DIAGNOSIS — O99213 Obesity complicating pregnancy, third trimester: Secondary | ICD-10-CM

## 2019-11-26 DIAGNOSIS — O43193 Other malformation of placenta, third trimester: Secondary | ICD-10-CM

## 2019-11-26 DIAGNOSIS — Z362 Encounter for other antenatal screening follow-up: Secondary | ICD-10-CM | POA: Diagnosis not present

## 2019-11-26 LAB — AMNISURE RUPTURE OF MEMBRANE (ROM) NOT AT ARMC: Amnisure ROM: NEGATIVE

## 2019-11-26 MED ORDER — VALACYCLOVIR HCL 1 G PO TABS: 1000.0000 mg | ORAL_TABLET | Freq: Every day | ORAL | 1 refills | Status: DC

## 2019-11-26 MED ORDER — DOCUSATE SODIUM 100 MG PO CAPS: 100.0000 mg | ORAL_CAPSULE | Freq: Two times a day (BID) | ORAL | 0 refills | Status: DC | PRN

## 2019-11-26 MED ORDER — POLYSACCHARIDE IRON COMPLEX 150 MG PO CAPS: 150.0000 mg | ORAL_CAPSULE | Freq: Every day | ORAL | 1 refills | Status: AC

## 2019-11-26 NOTE — Progress Notes (Signed)
Discharge instructions reviewed with patient. Discussed medication changes dosages/timing, follow up in 2 weeks.  Reinforced MD instructions with patient.  Patient verbalized understanding. IV removed.

## 2019-11-26 NOTE — Discharge Summary (Signed)
Physician Discharge Summary  Patient ID: Ann Fields MRN: [id number] DOB/AGE: 11-20-1995 24 y.o.  Admit date: 10/28/2019 Discharge date: 11/26/2019  Admission Diagnoses:  Discharge Diagnoses:  Principal Problem:   PPROM 24 wks Active Problems:   Incompetent cervix during second trimester, antepartum   Anemia affecting pregnancy in second trimester   Discharged Condition: good  Hospital Course: Pt was admitted with suspected PPROM and advanced cervical dilation.  She was put on BR and given BMZ.  She was seen by both NICU and MFM.  Her [place] today demonstrates normal growth and fluid with neg amnisure.  Pt desires to go home on bed rest and follow up in the office  Consults: MFM and NICU  Significant Diagnostic Studies: [place]  Treatments: IV hydration, antibiotics: PCN and steroids: BMZ  Discharge Exam: Blood pressure 112/71, pulse 87, temperature 98.2 F (36.8 C), temperature source Oral, resp. rate 18, height 5\' 5"  (1.651 m), weight 104.3 kg, last menstrual period 05/04/2019, SpO2 100 %. General appearance: alert and cooperative Resp: clear to auscultation bilaterally Cardio: regular rate and rhythm GI: soft, non-tender; bowel sounds normal; no masses,  no organomegaly Pelvic: no VB or LOF Extremities: Homans sign is negative, no sign of DVT  Disposition: Discharge disposition: 01-Home or Self Care        Allergies as of 11/26/2019      Reactions   Latex Rash      Medication List    STOP taking these medications   acetaminophen 500 MG tablet Commonly known as: TYLENOL   prenatal multivitamin Tabs tablet     TAKE these medications   albuterol 108 (90 Base) MCG/ACT inhaler Commonly known as: VENTOLIN HFA Inhale into the lungs every 6 (six) hours as needed for wheezing or shortness of breath.   butalbital-acetaminophen-caffeine 50-325-40 MG tablet Commonly known as: FIORICET Take 1 tablet by mouth 2 (two) times daily as needed for headache.   docusate sodium  100 MG capsule Commonly known as: COLACE Take 1 capsule (100 mg total) by mouth 2 (two) times daily as needed for mild constipation or moderate constipation.   iron polysaccharides 150 MG capsule Commonly known as: NIFEREX Take 1 capsule (150 mg total) by mouth daily. Start taking on: November 27, 2019   omeprazole 20 MG capsule Commonly known as: PRILOSEC Take 20 mg by mouth daily.   PreNatal DHA 200 MG Caps Generic drug: Docosahexaenoic Acid Take 1 capsule (200 mg total) by mouth daily.   valACYclovir 1000 MG tablet Commonly known as: VALTREX Take 1 tablet (1,000 mg total) by mouth daily. Start taking on: November 27, 2019      Follow-up Information    Hellena Pridgen, Gwynneth Munson, MD. Schedule an appointment as soon as possible for a visit in 2 week(s).   Specialty: Obstetrics and Gynecology Contact information: [address] [place] [address] [phone]           Signed: Betsy Coder 11/26/2019, 12:09 PM

## 2019-11-28 ENCOUNTER — Inpatient Hospital Stay (EMERGENCY_DEPARTMENT_HOSPITAL)
Admission: EM | Admit: 2019-11-28 | Discharge: 2019-12-04 | Disposition: A | Discharge status: Home / Self Care | Payer: BC Managed Care – PPO | Source: Home / Self Care | Attending: Obstetrics & Gynecology | Admitting: Obstetrics & Gynecology

## 2019-11-28 ENCOUNTER — Encounter (HOSPITAL_COMMUNITY): Payer: Self-pay | Admitting: Obstetrics & Gynecology

## 2019-11-28 ENCOUNTER — Other Ambulatory Visit: Payer: Self-pay

## 2019-11-28 DIAGNOSIS — R58 Hemorrhage, not elsewhere classified: Secondary | ICD-10-CM | POA: Diagnosis not present

## 2019-11-28 DIAGNOSIS — O4693 Antepartum hemorrhage, unspecified, third trimester: Secondary | ICD-10-CM

## 2019-11-28 DIAGNOSIS — Z3A28 28 weeks gestation of pregnancy: Secondary | ICD-10-CM | POA: Diagnosis not present

## 2019-11-28 DIAGNOSIS — O3433 Maternal care for cervical incompetence, third trimester: Secondary | ICD-10-CM | POA: Diagnosis not present

## 2019-11-28 DIAGNOSIS — O479 False labor, unspecified: Secondary | ICD-10-CM | POA: Diagnosis not present

## 2019-11-28 HISTORY — DX: Unspecified convulsions: R56.9

## 2019-11-28 LAB — RESPIRATORY PANEL BY RT PCR (FLU A&B, COVID)
Influenza A by PCR: NEGATIVE
Influenza B by PCR: NEGATIVE
SARS Coronavirus 2 by RT PCR: NEGATIVE

## 2019-11-28 LAB — URINALYSIS, ROUTINE W REFLEX MICROSCOPIC
Bilirubin Urine: NEGATIVE
Glucose, UA: NEGATIVE mg/dL
Ketones, ur: NEGATIVE mg/dL
Nitrite: NEGATIVE
Protein, ur: NEGATIVE mg/dL
Specific Gravity, Urine: 1.004 — ABNORMAL LOW (ref 1.005–1.030)
pH: 7 (ref 5.0–8.0)

## 2019-11-28 LAB — TYPE AND SCREEN
ABO/RH(D): A POS
Antibody Screen: NEGATIVE

## 2019-11-28 LAB — CBC
HCT: 34.6 % — ABNORMAL LOW (ref 36.0–46.0)
Hemoglobin: 11.4 g/dL — ABNORMAL LOW (ref 12.0–15.0)
MCH: 27.8 pg (ref 26.0–34.0)
MCHC: 32.9 g/dL (ref 30.0–36.0)
MCV: 84.4 fL (ref 80.0–100.0)
Platelets: 255 10*3/uL (ref 150–400)
RBC: 4.1 MIL/uL (ref 3.87–5.11)
RDW: 14.7 % (ref 11.5–15.5)
WBC: 12.1 10*3/uL — ABNORMAL HIGH (ref 4.0–10.5)
nRBC: 0 % (ref 0.0–0.2)

## 2019-11-28 MED ORDER — ONDANSETRON HCL 4 MG/2ML IJ SOLN: 4.0000 mg | Freq: Four times a day (QID) | INTRAMUSCULAR | Status: DC | PRN

## 2019-11-28 MED ORDER — OXYCODONE-ACETAMINOPHEN 5-325 MG PO TABS: 1.0000 | ORAL_TABLET | ORAL | Status: DC | PRN

## 2019-11-28 MED ORDER — LACTATED RINGERS IV SOLN: 500.0000 mL | INTRAVENOUS | Status: DC | PRN

## 2019-11-28 MED ORDER — FLEET ENEMA 7-19 GM/118ML RE ENEM: 1.0000 | ENEMA | RECTAL | Status: DC | PRN

## 2019-11-28 MED ORDER — ACETAMINOPHEN 325 MG PO TABS: 650.0000 mg | ORAL_TABLET | ORAL | Status: DC | PRN

## 2019-11-28 MED ORDER — PENICILLIN G POT IN DEXTROSE 60000 UNIT/ML IV SOLN
3.0000 10*6.[IU] | INTRAVENOUS | Status: DC
  Administered 2019-11-28 – 2019-11-29 (×6): 3 10*6.[IU] via INTRAVENOUS
  Filled 2019-11-28 (×6): qty 50

## 2019-11-28 MED ORDER — BETAMETHASONE SOD PHOS & ACET 6 (3-3) MG/ML IJ SUSP
12.0000 mg | INTRAMUSCULAR | Status: AC
  Administered 2019-11-28 – 2019-11-29 (×2): 12 mg via INTRAMUSCULAR
  Filled 2019-11-28: qty 5

## 2019-11-28 MED ORDER — MAGNESIUM SULFATE BOLUS VIA INFUSION
4.0000 g | Freq: Once | INTRAVENOUS | Status: AC
  Administered 2019-11-28: 16:00:00 4 g via INTRAVENOUS
  Filled 2019-11-28: qty 1000

## 2019-11-28 MED ORDER — LACTATED RINGERS IV SOLN: INTRAVENOUS | Status: DC

## 2019-11-28 MED ORDER — OXYTOCIN BOLUS FROM INFUSION: 500.0000 mL | Freq: Once | INTRAVENOUS | Status: DC

## 2019-11-28 MED ORDER — LACTATED RINGERS IV BOLUS
1000.0000 mL | Freq: Once | INTRAVENOUS | Status: AC
  Administered 2019-11-28: 1000 mL via INTRAVENOUS

## 2019-11-28 MED ORDER — LIDOCAINE HCL (PF) 1 % IJ SOLN: 30.0000 mL | INTRAMUSCULAR | Status: DC | PRN

## 2019-11-28 MED ORDER — OXYCODONE-ACETAMINOPHEN 5-325 MG PO TABS: 2.0000 | ORAL_TABLET | ORAL | Status: DC | PRN

## 2019-11-28 MED ORDER — SODIUM CHLORIDE 0.9 % IV SOLN
5.0000 10*6.[IU] | Freq: Once | INTRAVENOUS | Status: AC
  Administered 2019-11-28: 17:00:00 5 10*6.[IU] via INTRAVENOUS
  Filled 2019-11-28: qty 5

## 2019-11-28 MED ORDER — OXYTOCIN 40 UNITS IN NORMAL SALINE INFUSION - SIMPLE MED
2.5000 [IU]/h | INTRAVENOUS | Status: DC
  Filled 2019-11-28: qty 1000

## 2019-11-28 MED ORDER — SOD CITRATE-CITRIC ACID 500-334 MG/5ML PO SOLN: 30.0000 mL | ORAL | Status: DC | PRN

## 2019-11-28 MED ORDER — MAGNESIUM SULFATE 40 GM/1000ML IV SOLN
2.0000 g/h | INTRAVENOUS | Status: DC
  Administered 2019-11-28 – 2019-11-29 (×2): 2 g/h via INTRAVENOUS
  Filled 2019-11-28 (×2): qty 1000

## 2019-11-28 NOTE — MAU Note (Addendum)
Patient came into MAU at [redacted]w[redacted]d gestation via EMS with c/o vaginal bleeding and feeling pressure that started an hour ago. Patient denies any LOF, patient is feeling fetal movement, and patient has felt 2 contractions since the bleeding started.

## 2019-11-28 NOTE — MAU Provider Note (Signed)
Chief Complaint:  Vaginal Bleeding and Abdominal Pain   First Provider Initiated Contact with Patient 11/28/19 1409     HPI: Ann Fields is a 24 y.o. G1P0000 at [redacted]w[redacted]d who presents to maternity admissions via EMS reporting vaginal bleeding & pelvic pressure. Was discharged from the hospital on Friday after being admitted to [facility] x 1 month. Was initially diagnosed with PPROM & cervical incompetence after a positive amnisure & cervix dilated 3 cm. As of Friday, she had a negative amnisure & normal fluid on ultrasound.  Reports pink spotting on toilet paper when she went to the bathroom today then felt pelvic pressure. Felt 2 contractions afterwards and has felt 4 here in MAU since being placed on monitor. Denies LOF. Good fetal movement.   Location: abdomen Quality: pressure Severity: 4/10 in pain scale Duration: 1 day Timing: intermittent Modifying factors: none Associated signs and symptoms: vaginal bleeding  Pregnancy Course: [facility]. Admitted for preterm labor. Received round of [place] 3/11 & 3/12  Past Medical History:  Diagnosis Date  . Endometriosis   . History of seizure    12-30-2017  per pt age 23---  x3 seizure's--  unknown cause after work-up done-- no seizure since  . Leiomyoma of uterus   . Menorrhagia   . Migraines   . Mild asthma    OB History  Gravida Para Term Preterm AB Living  1 0 0 0 0 0  SAB TAB Ectopic Multiple Live Births  0 0 0 0 0    # Outcome Date GA Lbr Len/2nd Weight Sex Delivery Anes PTL Lv  1 Current            Past Surgical History:  Procedure Laterality Date  . DILATATION & CURETTAGE/HYSTEROSCOPY WITH MYOSURE N/A 01/02/2018   Procedure: DILATATION & CURETTAGE/HYSTEROSCOPY WITH MYOSURE;  Surgeon: Anastasio Auerbach, MD;  Location: [place];  Service: Gynecology;  Laterality: N/A;  request to follow in [place] [facility] block time requests one hour  . HYSTEROSCOPY W/ POLYPECTOMY  02/2017  . WISDOM TOOTH EXTRACTION  age 39   Family  History  Problem Relation Age of Onset  . Diabetes Paternal Grandmother   . Hypertension Paternal Grandmother   . Depression Sister   . Cancer Maternal Grandmother        ovarian cancer  . Cancer Maternal Grandfather        pancreatic cancer  . Heart disease Paternal Grandfather    Social History   Tobacco Use  . Smoking status: Never Smoker  . Smokeless tobacco: Never Used  Substance Use Topics  . Alcohol use: Not Currently  . Drug use: No   Allergies  Allergen Reactions  . Latex Rash   Medications Prior to Admission  Medication Sig Dispense Refill Last Dose  . docusate sodium (COLACE) 100 MG capsule Take 1 capsule (100 mg total) by mouth 2 (two) times daily as needed for mild constipation or moderate constipation. 10 capsule 0 Past Week at Unknown time  . iron polysaccharides (NIFEREX) 150 MG capsule Take 1 capsule (150 mg total) by mouth daily. 90 capsule 1 Past Week at Unknown time  . prenatal vitamin w/FE, FA (PRENATAL 1 + 1) 27-1 MG TABS tablet Take 1 tablet by mouth daily at 12 noon.   11/27/2019 at Unknown time  . valACYclovir (VALTREX) 1000 MG tablet Take 1 tablet (1,000 mg total) by mouth daily. 90 tablet 1 Past Week at Unknown time  . albuterol (PROVENTIL HFA;VENTOLIN HFA) 108 (90 BASE) MCG/ACT  inhaler Inhale into the lungs every 6 (six) hours as needed for wheezing or shortness of breath.   Unknown at Unknown time  . butalbital-acetaminophen-caffeine (FIORICET) 50-325-40 MG tablet Take 1 tablet by mouth 2 (two) times daily as needed for headache.   Unknown at Unknown time  . Docosahexaenoic Acid (PRENATAL DHA) 200 MG CAPS Take 1 capsule (200 mg total) by mouth daily. (Patient not taking: Reported on 10/28/2019) 30 capsule 11   . omeprazole (PRILOSEC) 20 MG capsule Take 20 mg by mouth daily.   Unknown at Unknown time    I have reviewed patient's Past Medical Hx, Surgical Hx, Family Hx, Social Hx, medications and allergies.   ROS:  Review of Systems  Constitutional:  Negative.   Gastrointestinal: Negative.   Genitourinary: Positive for pelvic pain and vaginal bleeding. Negative for dysuria and vaginal discharge.    Physical Exam   Patient Vitals for the past 24 hrs:  BP Temp Temp src Pulse Resp SpO2  11/28/19 1432 115/69 -- -- 91 -- --  11/28/19 1406 129/75 -- -- 97 -- --  11/28/19 1345 (!) 126/95 -- -- (!) 101 -- 98 %  11/28/19 1340 -- 99 F (37.2 C) Oral -- 18 98 %  11/28/19 1335 -- -- -- -- -- 98 %  11/28/19 1332 132/81 -- -- 93 -- --    Constitutional: Well-developed, well-nourished female in no acute distress.  Cardiovascular: normal rate & rhythm, no murmur Respiratory: normal effort, lung sounds clear throughout GI: Abd soft, non-tender, gravid appropriate for gestational age. Pos BS x 4 MS: Extremities nontender, no edema, normal ROM Neurologic: Alert and oriented x 4.  GU:      Pelvic: NEFG, minimal amount of blood tinged mucous. No active bleeding   Dilation: 4.5 Effacement (%): 90 Cervical Position: Middle Station: -1 Presentation: Vertex(per BSUS) Exam by:: Jorje Guild NP  NST:  Baseline: 145 bpm, Variability: Good {> 6 bpm), Accelerations: Non-reactive but appropriate for gestational age and Decelerations: Variable: mild   Labs: Results for orders placed or performed during the hospital encounter of 11/28/19 (from the past 24 hour(s))  Urinalysis, Routine w reflex microscopic     Status: Abnormal   Collection Time: 11/28/19  2:16 PM  Result Value Ref Range   Color, Urine STRAW (A) YELLOW   APPearance CLEAR CLEAR   Specific Gravity, Urine 1.004 (L) 1.005 - 1.030   pH 7.0 5.0 - 8.0   Glucose, UA NEGATIVE NEGATIVE mg/dL   Hgb urine dipstick MODERATE (A) NEGATIVE   Bilirubin Urine NEGATIVE NEGATIVE   Ketones, ur NEGATIVE NEGATIVE mg/dL   Protein, ur NEGATIVE NEGATIVE mg/dL   Nitrite NEGATIVE NEGATIVE   Leukocytes,Ua TRACE (A) NEGATIVE   RBC / HPF 0-5 0 - 5 RBC/hpf   WBC, UA 0-5 0 - 5 WBC/hpf   Bacteria, UA RARE (A)  NONE SEEN   Squamous Epithelial / LPF 0-5 0 - 5    Imaging:  No results found.  MAU Course: Orders Placed This Encounter  Procedures  . Culture, OB Urine  . Culture, beta strep (group b only)  . Respiratory Panel by RT PCR (Flu A&B, Covid) - Nasopharyngeal Swab  . Urinalysis, Routine w reflex microscopic  . CBC  . RPR  . Diet clear liquid Room service appropriate? Yes; Fluid consistency: Thin  . Vitals signs per unit policy  . Notify Physician  . Fetal monitoring per unit policy  . Activity as tolerated  . Cervical Exam  . Measure blood pressure  post delivery every 15 min x 1 hour then every 30 min x 1 hour  . Fundal check post delivery every 15 min x 1 hour then every 30 min x 1 hour  . If Rapid HIV test positive or known HIV positive: initiate AZT orders  . May in and out cath x 2 for inability to void  . Insert foley catheter  . Discontinue foley prior to vaginal delivery  . Initiate Carrier Fluid Protocol  . Initiate Oral Care Protocol  . Order Rapid HIV per protocol if no results on chart  . Full code  . Type and screen [place]  . Insert and maintain IV Line  . Admit to Inpatient (patient's expected length of stay will be greater than 2 midnights or inpatient only procedure)   Meds ordered this encounter  Medications  . lactated ringers bolus 1,000 mL  . lactated ringers infusion  . oxytocin (PITOCIN) IV BOLUS FROM BAG  . oxytocin (PITOCIN) IV infusion 40 units in NS 1000 mL - Premix  . lactated ringers infusion 500-1,000 mL  . acetaminophen (TYLENOL) tablet 650 mg  . oxyCODONE-acetaminophen (PERCOCET/ROXICET) 5-325 MG per tablet 1 tablet  . oxyCODONE-acetaminophen (PERCOCET/ROXICET) 5-325 MG per tablet 2 tablet  . sodium phosphate (FLEET) 7-19 GM/118ML enema 1 enema  . ondansetron (ZOFRAN) injection 4 mg  . sodium citrate-citric acid (ORACIT) solution 30 mL  . lidocaine (PF) (XYLOCAINE) 1 % injection 30 mL  . FOLLOWED BY Linked Order Group    . penicillin G potassium 5 Million Units in sodium chloride 0.9 % 250 mL IVPB     Order Specific Question:   Antibiotic Indication:     Answer:   Group B Strep Prophylaxis   . penicillin G potassium 3 Million Units in dextrose 2mL IVPB     Order Specific Question:   Antibiotic Indication:     Answer:   Group B Strep Prophylaxis  . magnesium bolus via infusion 4 g  . magnesium sulfate 40 grams in SWI 1000 mL OB infusion  . betamethasone acetate-betamethasone sodium phosphate (CELESTONE) injection 12 mg    MDM: RH positive  Spec exam performed. Minimal blood tinged mucous. No active bleeding. Cervix 2.5/50/ballotable. Patient initially reported contractions "frequently" upon arrival to MAU. Upon reassessment reports no further contractions or pain.  Cervix rechecked after an hour, now 4.5cm/90%/BBOW  Pt informed that the ultrasound is considered a limited OB ultrasound and is not intended to be a complete ultrasound exam.  Patient also informed that the ultrasound is not being completed with the intent of assessing for fetal or placental anomalies or any pelvic abnormalities.  Explained that the purpose of today's ultrasound is to assess for  presentation.  Patient acknowledges the purpose of the exam and the limitations of the study.  Cephalic   Dr. Nelda Marseille notified of patient's presentation & exam. Will plan to admit to birthing suites. Repeat BMZ series. Mag sulfate.  Assessment: 1. Premature cervical dilation in third trimester   2. [redacted] weeks gestation of pregnancy     Plan: Admit to birthing suites  Jorje Guild, NP 11/28/2019 3:54 PM

## 2019-11-28 NOTE — Progress Notes (Signed)
S: Pt resting in bed with partner at the bedside. Denies any vaginal bleeding or leaking of fluid. States she has only felt one contraction in the last 2 hours. Endorses + fetal movement.   O: Vitals:   11/28/19 1659 11/28/19 1800 11/28/19 1853 11/28/19 2000  BP: 119/65 130/79 (!) 147/70 138/71  Pulse: 92 95 (!) 101 (!) 103  Resp: 16 18 18 19   Temp: 97.9 F (36.6 C)  98.2 F (36.8 C)   TempSrc: Axillary  Oral   SpO2:       FHT:  FHR: 145 bpm, variability: moderate,  accelerations:  Present,  decelerations:  Present occasional variable UC:   none SVE:   4/100/-1 BBOW protruding through cervix  A / P: Preterm labor  Fetal Wellbeing:  Category I Pain Control:  May have epidural as desired. Preterm labor: Will continue Magnesium, PCN for + GBS, and remain on labor and delivery  Will allow a light labor diet.   Anticipated MOD:  NSVD  Zettie Pho, MSN 11/28/2019, 8:03 PM

## 2019-11-28 NOTE — H&P (Signed)
OB ADMISSION/ HISTORY & PHYSICAL:  Admission Date: 11/28/2019  1:20 PM  Admit Diagnosis: Premature cervical dilation in third trimester [O34.33]    Ann Fields is a 24 y.o. female presenting for contractions and vaginal bleeding. Pt was discharged Friday from the antenatal unit after a 29 day stay for PPROM and preterm labor. Pt had an ultrasound on Friday (4/9) with AFI WNL and a negative Amnisure. She was deemed stable for discharge. Pt states that she was very uncomfortable last night and that this afternoon she felt 2 contractions that she rates 4/10 on pain scale. Pt states she went to the restroom around noon and had bright red vaginal bleeding. She states she called EMS for transport to the hospital. Since being in MAU, she states she feels increased contractions and pelvic pressure. Initial SVE in MAU was 2.5/50 and 1 hour later she was 4.5/90/-1 with BBOW. Pt was admitted to labor and delivery. Partner, Chalmers Guest, at the bedside, providing support. Pt states she is only feeling occasional contractions now. Denies leaking of fluid. Endorses + fetal movement.  Prenatal History: G1P0000   EDC : 02/17/2020, by Ultrasound  [facility] at [facility]  Prenatal course complicated by: PPROM of unknown date, admitted at 24.0 with a 29 day stay. Was discharged on 4/9 at 28.1.   Prenatal Labs: ABO, Rh:   A POS Antibody: NEG (04/07 0726) Rubella: Immune (12/11 0000)  RPR: Nonreactive (12/11 0000)  HBsAg: Negative (12/11 0000)  HIV: Non-reactive (12/11 0000)  GBS:   Positive Genetic Screening: Low risk Ultrasound: Last ultrasound 11/26/19; AFI 11.82; EFW 2lb 12oz 56%, vertex, placenta anterior with a marginal cord insertion    Maternal Diabetes: No Genetic Screening: Normal Maternal Ultrasounds/Referrals: Normal Fetal Ultrasounds or other Referrals:  None Maternal Substance Abuse:  No Significant Maternal Medications:  None Significant Maternal Lab Results:  None Other Comments:   ? PPROM, see HPI  Medical / Surgical History :  Past medical history:  Past Medical History:  Diagnosis Date  . Endometriosis   . History of seizure    12-30-2017  per pt age 38---  x3 seizure's--  unknown cause after work-up done-- no seizure since  . Leiomyoma of uterus   . Menorrhagia   . Migraines   . Mild asthma   . Seizures ([place])    last one in [place]     Past surgical history:  Past Surgical History:  Procedure Laterality Date  . DILATATION & CURETTAGE/HYSTEROSCOPY WITH MYOSURE N/A 01/02/2018   Procedure: DILATATION & CURETTAGE/HYSTEROSCOPY WITH MYOSURE;  Surgeon: Anastasio Auerbach, MD;  Location: [facility];  Service: Gynecology;  Laterality: N/A;  request to follow in [place] gyn block time requests one hour  . HYSTEROSCOPY W/ POLYPECTOMY  02/2017  . WISDOM TOOTH EXTRACTION  age 30     Family History:  Family History  Problem Relation Age of Onset  . Diabetes Paternal Grandmother   . Hypertension Paternal Grandmother   . Depression Sister   . Cancer Maternal Grandmother        ovarian cancer  . Cancer Maternal Grandfather        pancreatic cancer  . Heart disease Paternal Grandfather      Social History:  reports that she has never smoked. She has never used smokeless tobacco. She reports previous alcohol use. She reports that she does not use drugs.   Allergies: Latex   Current Medications at time of admission:  Medications Prior to Admission  Medication Sig Dispense Refill Last Dose  . docusate sodium (COLACE) 100 MG capsule Take 1 capsule (100 mg total) by mouth 2 (two) times daily as needed for mild constipation or moderate constipation. 10 capsule 0 Past Week at Unknown time  . iron polysaccharides (NIFEREX) 150 MG capsule Take 1 capsule (150 mg total) by mouth daily. 90 capsule 1 Past Week at Unknown time  . prenatal vitamin w/FE, FA (PRENATAL 1 + 1) 27-1 MG TABS tablet Take 1 tablet by mouth daily at 12 noon.   11/27/2019 at  Unknown time  . valACYclovir (VALTREX) 1000 MG tablet Take 1 tablet (1,000 mg total) by mouth daily. 90 tablet 1 Past Week at Unknown time  . albuterol (PROVENTIL HFA;VENTOLIN HFA) 108 (90 BASE) MCG/ACT inhaler Inhale into the lungs every 6 (six) hours as needed for wheezing or shortness of breath.   Unknown at Unknown time  . butalbital-acetaminophen-caffeine (FIORICET) 50-325-40 MG tablet Take 1 tablet by mouth 2 (two) times daily as needed for headache.   Unknown at Unknown time  . Docosahexaenoic Acid (PRENATAL DHA) 200 MG CAPS Take 1 capsule (200 mg total) by mouth daily. (Patient not taking: Reported on 10/28/2019) 30 capsule 11   . omeprazole (PRILOSEC) 20 MG capsule Take 20 mg by mouth daily.   Unknown at Unknown time    Review of Systems: Review of Systems  Constitutional: Negative for chills and fever.  HENT: Negative for congestion and sore throat.   Eyes: Negative for blurred vision.  Respiratory: Negative for cough and shortness of breath.   Cardiovascular: Negative for chest pain and leg swelling.  Gastrointestinal: Positive for abdominal pain. Negative for heartburn, nausea and vomiting.  Genitourinary: Negative for dysuria and urgency.  Musculoskeletal: Negative for back pain and joint pain.  Neurological: Negative for dizziness and headaches.  Psychiatric/Behavioral: Negative for depression. The patient is not nervous/anxious.    Physical Exam: Vital signs and nursing notes reviewed.  ED Triage Vitals  Enc Vitals Group     BP 11/28/19 1332 132/81     Pulse Rate 11/28/19 1332 93     Resp 11/28/19 1340 18     Temp 11/28/19 1340 99 F (37.2 C)     Temp Source 11/28/19 1340 Oral     SpO2 11/28/19 1335 98 %     Weight --      Height --      Head Circumference --      Peak Flow --      Pain Score 11/28/19 1330 4     Pain Loc --      Pain Edu? --      Excl. in [place]? --      General: AAO x 3, NAD, coping well, appropriately anxious about situation Heart:  RRR Lungs:CTAB Abdomen: Gravid, NT Extremities: negative edema Genitalia / VE: Dilation: 4.5 Effacement (%): 90 Cervical Position: Middle Station: -1 Presentation: Vertex(per BSUS) Exam by:: Jorje Guild NP   FHR: 140BPM, mod variability, + accels, occasional variable decels TOCO: Ctx occasional  Labs:   Pending T&S, CBC, RPR  No results for input(s): WBC, HGB, HCT, PLT in the last 72 hours.  Assessment:  24 y.o. G1P0000 at [redacted]w[redacted]d  1. Preterm labor 2. FHR category 1 3. GBS positive 4. Desires unmedicated birth 5. Plans to breastfeed  Plan:  1. Admit to [facility] 2. Mag 4mg  bolus with 2gm hour for neuroprotection and tocolysis 3. Rescue dose of Betamethasone 4. PCN for GBS +  5. Analgesia/anesthesia  PRN  6. Expectant management 7. Anticipate NSVB  Dr Nelda Marseille notified of admission / plan of care  [place], MSN 11/28/2019, 4:37 PM

## 2019-11-29 LAB — CULTURE, OB URINE: Culture: >=100000 — AB

## 2019-11-29 LAB — RPR: RPR Ser Ql: NONREACTIVE

## 2019-11-29 MED ORDER — SODIUM CHLORIDE 0.9 % IV SOLN: 250.0000 mL | INTRAVENOUS | Status: DC | PRN

## 2019-11-29 MED ORDER — DOCUSATE SODIUM 100 MG PO CAPS
100.0000 mg | ORAL_CAPSULE | Freq: Every day | ORAL | Status: DC
  Administered 2019-11-30 – 2019-12-02 (×3): 100 mg via ORAL
  Filled 2019-11-29 (×3): qty 1

## 2019-11-29 MED ORDER — SODIUM CHLORIDE 0.9% FLUSH: 3.0000 mL | INTRAVENOUS | Status: DC | PRN

## 2019-11-29 MED ORDER — PRENATAL MULTIVITAMIN CH
1.0000 | ORAL_TABLET | Freq: Every day | ORAL | Status: DC
  Administered 2019-11-30 – 2019-12-02 (×3): 1 via ORAL
  Filled 2019-11-29 (×3): qty 1

## 2019-11-29 MED ORDER — VALACYCLOVIR HCL 500 MG PO TABS
1000.0000 mg | ORAL_TABLET | Freq: Every day | ORAL | Status: DC
  Administered 2019-11-30 – 2019-12-02 (×3): 1000 mg via ORAL
  Filled 2019-11-29 (×3): qty 2

## 2019-11-29 MED ORDER — CALCIUM CARBONATE ANTACID 500 MG PO CHEW: 2.0000 | CHEWABLE_TABLET | ORAL | Status: DC | PRN

## 2019-11-29 MED ORDER — SODIUM CHLORIDE 0.9% FLUSH
3.0000 mL | Freq: Two times a day (BID) | INTRAVENOUS | Status: DC
  Administered 2019-11-29 – 2019-12-01 (×4): 3 mL via INTRAVENOUS

## 2019-11-29 MED ORDER — ALBUTEROL SULFATE (2.5 MG/3ML) 0.083% IN NEBU: 3.0000 mL | INHALATION_SOLUTION | Freq: Four times a day (QID) | RESPIRATORY_TRACT | Status: DC | PRN

## 2019-11-29 MED ORDER — ZOLPIDEM TARTRATE 5 MG PO TABS: 5.0000 mg | ORAL_TABLET | Freq: Every evening | ORAL | Status: DC | PRN

## 2019-11-29 MED ORDER — ACETAMINOPHEN 325 MG PO TABS: 650.0000 mg | ORAL_TABLET | ORAL | Status: DC | PRN

## 2019-11-29 NOTE — Progress Notes (Addendum)
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  Initially OBS x 28 days  MgS04 completed BMZ complete Latency ABX complete MFM and NICU consults completed Reevaluated at 28.0 weeks, deemed not ROM and discharged home.   She then returned 48 hours later on 4/11 with new onset vaginal bleeding SVE changed from 3 cm to 4/100/-1 BBOW in MAU She was admitted for MgSO4 and rescue BMZ which was complete today at 1600 PCN was started for GBS prophylaxis in the event she was laboring  S: Resting in bed. Reports feeling well. Denies cramping/pain, or new bleeding. Reports adequate fetal movement. Ready to move back to antenatal obs!  O: BP 124/68   Pulse 95   Temp 97.6 F (36.4 C) (Oral)   Resp 18   LMP 05/04/2019   SpO2 100%    FHT: 140s reactive NST SVE: internal os 2-3/50/-1  A/P:24 y.o. re-admitted with cervical incompetence and vaginal bleeding Preterm labor management: modified bedrest advised Afebrile, no s/sx chorio Dating:  [redacted]w[redacted]d * PNL Needed:  3T labs at 28 wks however rescue dose BMZ just completed * FWB:  Reassuring for gestational age * PTL:  Mag Sulfate x 24 H 11/28/19             BMZ course 3/11, 3/12. Rescue 4/11, 4/12  Discussed plan of care with Dr. Alesia Richards -No cervical change or contractions  -DC MgSO4 -BMZ rescue complete -Transfer to antenatal obs -Regular diet -NST q shift -Toco PRN contractions -Bedrest w BR privileges - H/O of HSV only with labs - valtrex daily - Hx asthma - albuterol MDI PRN -Reassess status accordingly after MgSO4 stopped    Ann Fields, CNM Attestation of Attending Supervision of Advanced Practitioner (CNM/NP): Evaluation and management procedures were performed by the Advanced Practitioner under my supervision and collaboration.  I have reviewed the Advanced Practitioner's note and chart, and I agree with  the management and plan. I saw and examined patient at bedside and agree with above findings, assessment and plan as outlined above by [facility] [place].   Dr. Alesia Richards.  11/29/2019.

## 2019-11-30 LAB — CULTURE, BETA STREP (GROUP B ONLY)

## 2019-11-30 NOTE — Progress Notes (Addendum)
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  Initially OBS x 28 days  MgS04 completed BMZ complete Latency ABX complete MFM and NICU consults completed Reevaluated at 28.0 weeks, deemed not ROM and discharged home.   She then returned 48 hours later on 4/11 with new onset vaginal bleeding She was admitted for MgSO4 and rescue BMZ which was complete today at 1600 PCN was started for GBS prophylaxis in the event she was laboring  S:Sleeping in bed, easily awakened. No c/o, denies pain or VB.   O:  Today's Vitals   11/30/19 0635 11/30/19 0828 11/30/19 0928 11/30/19 1037  BP:   (!) 113/49   Pulse:   79   Resp:   18   Temp:   98.4 F (36.9 C)   TempSrc:   Oral   SpO2:   99%   PainSc: Asleep Asleep  0-No pain   There is no height or weight on file to calculate BMI. FHT: 150, mod var, + accels, no decels, reactive NST SVE: internal os 2-3/50/-1 on readmit by CNM  A/P:24 y.o.re-admitted with cervical incompetence and vaginal bleeding Preterm labor management:modified bedrest advised Afebrile, no s/sx chorio Dating: [redacted]w[redacted]d *PNL Needed:3T labs at 28 wks however rescue dose BMZ just completed *[id number] for gestational age *PTL:Mag Sulfate x 24 H 11/28/19 BMZ course 3/11, 3/12. Rescue 4/11, 4/12   -No cervical change or contractions  - s/p Mag Sulfate infusion, no sx of PTL -BMZ rescue complete -Regular diet -NST q shift -Toco PRN contractions -Bedrest w BR privileges -H/O of HSV only with labs-valtrex daily - Hx asthma - albuterol MDI PRN  Ann Pina, MSN, CNM 11/30/2019, 12:28 PM  Attestation of Attending Supervision of Advanced Practitioner (CNM/NP): Evaluation and management procedures were performed by the Advanced Practitioner under my supervision and collaboration.  I have reviewed the Advanced  Practitioner's note and chart, and I agree with the management and plan. I saw and examined patient at bedside and agree with above findings, assessment and plan as outlined above by [place].  Patient reports small pink vaginal spotting especially after she wipes herself.  [place] has small pink spotting.  We discussed plan for an OB ultrasound tomorrow to check fluid and placenta.  Patient desires outpatient management if possible. We discussed will recheck cervix in the morning and f/u on ultrasound results and decide on further plan of care.  Dr. Alesia Richards.  11/30/2019.  1912.

## 2019-12-01 ENCOUNTER — Inpatient Hospital Stay (HOSPITAL_BASED_OUTPATIENT_CLINIC_OR_DEPARTMENT_OTHER): Payer: BC Managed Care – PPO

## 2019-12-01 DIAGNOSIS — O99213 Obesity complicating pregnancy, third trimester: Secondary | ICD-10-CM

## 2019-12-01 DIAGNOSIS — O42913 Preterm premature rupture of membranes, unspecified as to length of time between rupture and onset of labor, third trimester: Secondary | ICD-10-CM | POA: Diagnosis not present

## 2019-12-01 DIAGNOSIS — Z3A28 28 weeks gestation of pregnancy: Secondary | ICD-10-CM

## 2019-12-01 DIAGNOSIS — E669 Obesity, unspecified: Secondary | ICD-10-CM | POA: Diagnosis not present

## 2019-12-01 DIAGNOSIS — O4693 Antepartum hemorrhage, unspecified, third trimester: Secondary | ICD-10-CM

## 2019-12-01 DIAGNOSIS — O3433 Maternal care for cervical incompetence, third trimester: Secondary | ICD-10-CM

## 2019-12-01 DIAGNOSIS — O43193 Other malformation of placenta, third trimester: Secondary | ICD-10-CM

## 2019-12-01 LAB — TYPE AND SCREEN
ABO/RH(D): A POS
Antibody Screen: NEGATIVE

## 2019-12-01 NOTE — Consult Note (Signed)
MFM Consult  Ann Fields was seen for a follow-up consultation due to vaginal bleeding probably related to preterm contractions/preterm labor.  She is currently at 28 weeks and 6 days.  The patient was previously hospitalized for about a month after presumed rupture of membranes.  Last week, at 28 weeks, after the patient reported no further leakage of fluid and her AFI remained within normal limits, she was discharged home.  She then returned to the MAU complaining of contractions along with vaginal bleeding two days later.  The patient denied any recent intercourse.  Her cervical exam at the time of readmission was 2 to 3 cm dilated and 50% effaced.  Due to vaginal bleeding and preterm labor, the patient received a rescue course of steroids and magnesium sulfate for fetal neuro protection.  She reports that she has not experienced any further vaginal bleeding since yesterday.  Her contractions have also resolved.  Her fetal status has been reassuring.  She had a limited ultrasound performed today that showed a normal-appearing anterior placenta.  There were no signs of placenta previa noted today.  Her AFI was within normal limits (11.5cm).  The fetus was in the vertex presentation.  The patient was advised that as she has not complained of leakage of fluid for the past few weeks and as her AFI has remained stable in the normal range, that she probably did not rupture membranes.  Due to preterm labor with vaginal bleeding, I would recommend that she remain in the hospital for 7 days following her last episode of vaginal bleeding.  Should her contractions return, a course of Indocin (50 mg p.o. x1 followed by 25 mg p.o. every 6 hours to complete a 48-hour course) or nifedipine 10 to 20 mg every 4-6 hours may be considered for tocolysis.  Indocin should not be given at 32 weeks or greater.  Magnesium sulfate should be given for fetal neuro protection should she be at risk for delivery at 32 weeks or less.   As she has already received two courses of antenatal corticosteroids, no further steroid courses are indicated.  The patient understands that we will have to reassess on a weekly basis whether or not she is stable for discharge.  The decision for inpatient versus outpatient management would be based on whether or not she continues to have vaginal bleeding and contractions.    Both the patient and her partner are agreeable with the plan for another 7 days of observation in the hospital.    Thank you for referring this patient for Maternal-Fetal Medicine consultation.    At the end of the consultation, both the patient and her partner stated that all their questions have been answered to their complete satisfaction.  Recommendations:  -Continue inpatient observation for another 7 days following her last episode of vaginal bleeding  -Tocolysis with either Indocin or nifedipine as necessary  -Magnesium sulfate for fetal neuro protection should she be at risk for delivery prior to 32 weeks  -Consider discharge home if she does not experience any further vaginal bleeding and her cervix remains unchanged for 7 days  -No further steroid courses are indicated

## 2019-12-01 NOTE — Progress Notes (Addendum)
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  Initially OBS x 28 days, course as follows MgS04 completed BMZ complete 3/11, 3/12 Latency ABX complete MFM and NICU consults completed Reevaluated at 28.0 weeks, deemed not ROM and discharged home.   She then returned 48 hours later on 4/11 with new onset vaginal bleeding She was admitted for MgSO4 and rescue BMZ, completed 4/11, 4/12 PCN was started for GBS prophylaxis in the event she was laboring, now stopped  S: Sleeping in bed, easily awakened. Denies contractions, leaking of fluid, or vaginal bleeding. Endorses + fetal movement.   O:  Today's Vitals   12/01/19 0610 12/01/19 0849 12/01/19 0900 12/01/19 1208  BP:  118/62  (!) 118/50  Pulse:  86  89  Resp:  18  18  Temp:  98.2 F (36.8 C)  98.1 F (36.7 C)  TempSrc:  Oral  Oral  SpO2:  99%  99%  PainSc: Asleep  0-No pain Asleep   There is no height or weight on file to calculate BMI. FHT: 150, mod var, + accels, no decels, reactive NST SVE: last SVE 2-3/50/-1 on 11/29/19 MFM U/S on 4/14 - results pending  A/P: 24 y.o.re-admitted with preterm contractions and vaginal bleeding Afebrile, no s/sx chorio Dating: [redacted]w[redacted]d *PNL Needed:3T labs at 28 wks however rescue dose BMZ just completed, will consider 10 days after last BMZ *[id number] for gestational age *PTL:Mag Sulfate x 24 H, stopped 11/29/19 BMZ course 3/11, 3/12. Rescue 4/11, 4/12 MFM consult today. Recommendations pending.  Regular diet NST q shift Toco PRN contractions Bedrest w BR privileges H/O of HSV only with labs-valtrex daily Hx asthma - albuterol MDI PRN  Suzan Nailer, MSN, CNM 12/01/2019, 2:55 PM   Pt reports last episode of brownish discharge last night at 11p.  Nothing today.  Denies ctxs.  No calf tenderness.  Abd  soft, NT.

## 2019-12-01 NOTE — Plan of Care (Signed)
  Problem: Education: Goal: Knowledge of General Education information will improve Description: Including pain rating scale, medication(s)/side effects and non-pharmacologic comfort measures Outcome: Completed/Met

## 2019-12-02 LAB — CBC
HCT: 35.2 % — ABNORMAL LOW (ref 36.0–46.0)
Hemoglobin: 11.4 g/dL — ABNORMAL LOW (ref 12.0–15.0)
MCH: 27.6 pg (ref 26.0–34.0)
MCHC: 32.4 g/dL (ref 30.0–36.0)
MCV: 85.2 fL (ref 80.0–100.0)
Platelets: 274 10*3/uL (ref 150–400)
RBC: 4.13 MIL/uL (ref 3.87–5.11)
RDW: 14.7 % (ref 11.5–15.5)
WBC: 16.1 10*3/uL — ABNORMAL HIGH (ref 4.0–10.5)
nRBC: 0 % (ref 0.0–0.2)

## 2019-12-02 MED ORDER — LIDOCAINE HCL (PF) 1 % IJ SOLN: 30.0000 mL | INTRAMUSCULAR | Status: DC | PRN

## 2019-12-02 MED ORDER — SODIUM CHLORIDE 0.9 % IV SOLN
5.0000 10*6.[IU] | Freq: Once | INTRAVENOUS | Status: AC
  Administered 2019-12-02: 16:00:00 5 10*6.[IU] via INTRAVENOUS
  Filled 2019-12-02: qty 5

## 2019-12-02 MED ORDER — OXYCODONE-ACETAMINOPHEN 5-325 MG PO TABS: 1.0000 | ORAL_TABLET | ORAL | Status: DC | PRN

## 2019-12-02 MED ORDER — OXYCODONE-ACETAMINOPHEN 5-325 MG PO TABS: 2.0000 | ORAL_TABLET | ORAL | Status: DC | PRN

## 2019-12-02 MED ORDER — MAGNESIUM SULFATE 40 GM/1000ML IV SOLN
INTRAVENOUS | Status: AC
  Filled 2019-12-02: qty 1000

## 2019-12-02 MED ORDER — INDOMETHACIN 25 MG PO CAPS
25.0000 mg | ORAL_CAPSULE | Freq: Four times a day (QID) | ORAL | Status: DC
  Administered 2019-12-02: 19:00:00 25 mg via ORAL
  Filled 2019-12-02 (×5): qty 1

## 2019-12-02 MED ORDER — LACTATED RINGERS IV SOLN: INTRAVENOUS | Status: DC

## 2019-12-02 MED ORDER — INDOMETHACIN 25 MG PO CAPS
50.0000 mg | ORAL_CAPSULE | Freq: Once | ORAL | Status: AC
  Administered 2019-12-02: 13:00:00 50 mg via ORAL
  Filled 2019-12-02: qty 2

## 2019-12-02 MED ORDER — ACETAMINOPHEN 325 MG PO TABS: 650.0000 mg | ORAL_TABLET | ORAL | Status: DC | PRN

## 2019-12-02 MED ORDER — SOD CITRATE-CITRIC ACID 500-334 MG/5ML PO SOLN: 30.0000 mL | ORAL | Status: DC | PRN

## 2019-12-02 MED ORDER — OXYTOCIN 40 UNITS IN NORMAL SALINE INFUSION - SIMPLE MED
2.5000 [IU]/h | INTRAVENOUS | Status: DC
  Filled 2019-12-02: qty 1000

## 2019-12-02 MED ORDER — MAGNESIUM SULFATE 40 GM/1000ML IV SOLN: 2.0000 g/h | INTRAVENOUS | Status: AC

## 2019-12-02 MED ORDER — OXYTOCIN BOLUS FROM INFUSION
500.0000 mL | Freq: Once | INTRAVENOUS | Status: AC
  Administered 2019-12-03: 500 mL via INTRAVENOUS

## 2019-12-02 MED ORDER — LACTATED RINGERS IV SOLN: 500.0000 mL | INTRAVENOUS | Status: DC | PRN

## 2019-12-02 MED ORDER — MAGNESIUM SULFATE BOLUS VIA INFUSION
4.0000 g | Freq: Once | INTRAVENOUS | Status: AC
  Administered 2019-12-02: 12:00:00 4 g via INTRAVENOUS
  Filled 2019-12-02: qty 1000

## 2019-12-02 MED ORDER — ONDANSETRON HCL 4 MG/2ML IJ SOLN: 4.0000 mg | Freq: Four times a day (QID) | INTRAMUSCULAR | Status: DC | PRN

## 2019-12-02 MED ORDER — PENICILLIN G POT IN DEXTROSE 60000 UNIT/ML IV SOLN
3.0000 10*6.[IU] | INTRAVENOUS | Status: DC
  Administered 2019-12-02 – 2019-12-03 (×2): 3 10*6.[IU] via INTRAVENOUS
  Filled 2019-12-02 (×2): qty 50

## 2019-12-02 NOTE — Progress Notes (Signed)
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  Initially OBS x 28 days, course as follows MgS04 completed BMZ complete 3/11, 3/12 Latency ABX complete MFM and NICU consults completed Reevaluated at 28.0 weeks, deemed not ROM and discharged home.   She then returned 48 hours later on 4/11 with new onset vaginal bleeding She was admitted for MgSO4 and rescue BMZ, completed 4/11, 4/12  Subjective: Pt stable in bed, was called by RN per pt cxt started and got worse. I visited the pt this morning around 9am, pt endorses having one cxt in in the last hour of that , then after I left pt started having cxt again, pt stable and grimacing through the cxt, pt denies pain management at this time, was started on magnesium 4gm bolus with 2gm/hr after, indomethacin 50mg  now po,. Then transferred top LD. Dr Alesia Richards notified.  Patient Active Problem List   Diagnosis Date Noted  . Premature cervical dilation in third trimester 11/28/2019  . Anemia affecting pregnancy in second trimester 11/02/2019  . PPROM 24 wks 10/31/2019  . Incompetent cervix during second trimester, antepartum 10/28/2019   Objective: BP 138/86   Pulse 90   Temp 98.1 F (36.7 C) (Oral)   Resp 18   LMP 05/04/2019   SpO2 98%  I/O last 3 completed shifts: In: 3 [I.V.:3] Out: -  No intake/output data recorded. NST: FHR baseline 150 bpm, Variability: moderate, Accelerations:present, Decelerations:  Absent= Cat 1/Reactive CTX:  irregular, every 2-4 minutes, lasting 30-50 seconds Uterus gravid, soft non tender, moderate to palpate with contractions.  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  Initially OBS x 28 days, course as follows MgS04 completed BMZ complete 3/11, 3/12 Latency ABX complete MFM and NICU consults completed Reevaluated at 28.0 weeks, deemed not ROM and discharged home.   She then returned 48 hours later on 4/11 with new onset vaginal bleeding She was admitted for MgSO4 and rescue BMZ, completed 4/11, 4/12   Currently transferred from ante to LD for preterm labor, SVE was 5/100/0 with BBOW.  Patient Active Problem List   Diagnosis Date Noted  . Premature cervical dilation in third trimester 11/28/2019  . Anemia affecting pregnancy in second trimester 11/02/2019  . PPROM 24 wks 10/31/2019  . Incompetent cervix during second trimester, antepartum 10/28/2019   NICHD: Category 1  Membranes:  Intact, no s/s of infection  Pain management:               IV pain management: x Declines             Epidural placement:  PRN  GBS Negative  PTL: SVD 5/100/0, with BBOW, vertex verified via sutures.   Plan: Continue labor plan Continuous monitoring Rest Bedrest PTL: 4gm magnesium bolus to follow with 2gm/hr for neruo-protectant. Indomethacin 50mg  PO order now then continue with 25mg  PO Q6H for 48 hours.  NICU aware.  Frequent position changes to facilitate fetal rotation and descent. Will reassess with cervical exam at if necessary Anticipate labor progression and vaginal delivery.   Md [facility] aware of plan and verbalized agreement.   [name], NP-C, CNM, MSN 12/02/2019.  12:25 PM

## 2019-12-02 NOTE — Progress Notes (Addendum)
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  Initially OBS x 28 days, course as follows MgS04 completed BMZ complete 3/11, 3/12 Latency ABX complete MFM and NICU consults completed Reevaluated at 28.0 weeks, deemed not ROM and discharged home.   She then returned 48 hours later on 4/11 with new onset vaginal bleeding She was admitted for MgSO4 and rescue BMZ, completed 4/11, 4/12  Subjective: Pt stable in bed, still having cxt but feeling like they have spaced. Pt not need pain meds and resting with partner at bedside.  Patient Active Problem List   Diagnosis Date Noted  . Premature cervical dilation in third trimester 11/28/2019  . Anemia affecting pregnancy in second trimester 11/02/2019  . PPROM 24 wks 10/31/2019  . Incompetent cervix during second trimester, antepartum 10/28/2019   Objective: BP 127/75   Pulse 83   Temp 98.8 F (37.1 C) (Oral)   Resp 16   LMP 05/04/2019   SpO2 98%  I/O last 3 completed shifts: In: 3 [I.V.:3] Out: -  Total I/O In: 100 [P.O.:100] Out: 500 [Urine:500] NST: FHR baseline 145 bpm, Variability: moderate, Accelerations:present, Decelerations:  Absent= Cat 1/Reactive CTX:  irregular, every 3-6 minutes, lasting 30-50 seconds, not showing on toco, pt grimaces with cxt.  Uterus gravid, soft non tender, moderate to palpate with contractions.  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  Initially OBS x 28 days, course as follows MgS04 completed BMZ complete 3/11, 3/12 Latency ABX complete MFM and NICU consults  completed Reevaluated at 28.0 weeks, deemed not ROM and discharged home.   She then returned 48 hours later on 4/11 with new onset vaginal bleeding She was admitted for MgSO4 and rescue BMZ, completed 4/11, 4/12   Currently transferred from ante to LD for preterm labor, SVE was 5/100/0 with BBOW. Progressing naturally in active labor.  Patient Active Problem List   Diagnosis Date Noted  . Premature cervical dilation in third trimester 11/28/2019  . Anemia affecting pregnancy in second trimester 11/02/2019  . PPROM 24 wks 10/31/2019  . Incompetent cervix during second trimester, antepartum 10/28/2019   NICHD: Category 1  Membranes:  Intact, no s/s of infection  Pain management:               IV pain management: x Declines             Epidural placement:  PRN  GBS Negative on 4/11, but DR Alesia Richards noted GBS at earlier date, therefore tx was recommended with  penicillin per Dr Alesia Richards.   PTL: SVD 6/100/0, with BBOW, vertex verified via sutures, S/P Indomethacin 50mg  PO @1256 , S/P 4gm  magnesium bolus.  Plan: Continue labor plan Continuous monitoring Rest Bedrest PTL:  currently on 2gm/hr for neruo-protectant and continue with 25mg  PO Q6H for 48 hours.  GBS +/-: Prophylactic tx with penicillin.  NICU aware.  Will reassess with cervical exam at if necessary Anticipate labor progression and vaginal delivery.   Md [facility] aware of plan and verbalized agreement.   [name], NP-C, CNM, MSN 12/02/2019. 2:42 PM   MD Addendum: I reviewed chart and agree with above findings, assessment and plan as outlined  above by [facility].  Discussed case with Dr. Annamaria Boots who recommended magnesium sulfate for 12 hours for neuroprotection and Indocin for 48 hours for tocolysis.  Category 1 tracing noted at 1616.   Dr. Alesia Richards. 12/02/2019. 1617.

## 2019-12-02 NOTE — Progress Notes (Signed)
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  Initially OBS x 28 days, course as follows MgS04 completed BMZ complete 3/11, 3/12 Latency ABX complete MFM and NICU consults completed Reevaluated at 28.0 weeks, deemed not ROM and discharged home.   She then returned 48 hours later on 4/11 with new onset vaginal bleeding She was admitted for MgSO4 and rescue BMZ, completed 4/11, 4/12  Subjective: Pt stable in bed, still having cxt but endorses they have spaced apart. Pt tolerates well.  Patient Active Problem List   Diagnosis Date Noted  . Premature cervical dilation in third trimester 11/28/2019  . Anemia affecting pregnancy in second trimester 11/02/2019  . PPROM 24 wks 10/31/2019  . Incompetent cervix during second trimester, antepartum 10/28/2019   Objective: BP (!) 117/59   Pulse (!) 116   Temp 98.8 F (37.1 C) (Oral)   Resp 16   LMP 05/04/2019   SpO2 98%  I/O last 3 completed shifts: In: 3 [I.V.:3] Out: -  Total I/O In: 250 [P.O.:250] Out: 1050 [Urine:1050] NST: FHR baseline 150 bpm, Variability: moderate, Accelerations:present, Decelerations:  Absent= Cat 1/Reactive CTX:  irregular, every 1 in 10 minutes, lasting 30-50 seconds, not showing on toco, pt grimaces with cxt.  Uterus gravid, soft non tender, mild to palpate with contractions.  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  Initially OBS x 28 days, course as follows MgS04 completed BMZ complete 3/11, 3/12 Latency ABX complete MFM and NICU consults completed Reevaluated at 28.0 weeks,  deemed not ROM and discharged home.   She then returned 48 hours later on 4/11 with new onset vaginal bleeding She was admitted for MgSO4 and rescue BMZ, completed 4/11, 4/12   Currently on LD for preterm labor, SVE was 6/100/0 with BBOW. Progressing naturally but in prodromal labor at 6cm and stalled on magnesium and indomethacin help reduce cxts.   Patient Active Problem List   Diagnosis Date Noted  . Premature cervical dilation in third trimester 11/28/2019  . Anemia affecting pregnancy in second trimester 11/02/2019  . PPROM 24 wks 10/31/2019  . Incompetent cervix during second trimester, antepartum 10/28/2019   NICHD: Category 1  Membranes:  Intact, no s/s of infection  Pain management:               IV pain management: x Declines             Epidural placement:  PRN  GBS Negative on 4/11, but DR Alesia Richards noted GBS at earlier date, therefore tx was recommended with  penicillin per Dr Alesia Richards. Last dose given at 1552 X1 dose   PTL: SVD 6/100/0, with BBOW, vertex verified via sutures, S/P Indomethacin 50mg  PO @1256 , S/P 4gm  magnesium bolus.  Plan: Continue labor plan Continuous monitoring Rest Bedrest PTL:  currently on 2gm/hr for neruo-protectant and continue with 25mg  PO Q6H for 48 hours.  GBS +/-: Prophylactic tx with penicillin.  NICU aware.  Will reassess with cervical exam at if necessary Anticipate labor progression and vaginal delivery.   Md [facility] aware of plan and verbalized agreement.   [name], NP-C, CNM, MSN 12/02/2019. 6:52 PM

## 2019-12-02 NOTE — Progress Notes (Signed)
Mag bolus started at [id number] per verbal order from [facility]. EFM removed and pt transported to L/D at 1211 at request of CNM.  L/D charge nurse notified at 1210 of plan to transport.   [facility] present for entire transport.   Report given at bedside to Tempie Hoist RN.

## 2019-12-02 NOTE — Progress Notes (Signed)
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  Initially OBS x 28 days, course as follows MgS04 completed BMZ complete 3/11, 3/12 Latency ABX complete MFM and NICU consults completed Reevaluated at 28.0 weeks, deemed not ROM and discharged home.   She then returned 48 hours later on 4/11 with new onset vaginal bleeding She was admitted for MgSO4 and rescue BMZ, completed 4/11, 4/12  Subjective: Pt was on bedpan and tolerated well, RN called with blood noted in the bedpan. Pt having cxt that have spaced out but continues. Patient Active Problem List   Diagnosis Date Noted  . Premature cervical dilation in third trimester 11/28/2019  . Anemia affecting pregnancy in second trimester 11/02/2019  . PPROM 24 wks 10/31/2019  . Incompetent cervix during second trimester, antepartum 10/28/2019   Objective: BP 125/71   Pulse (!) 103   Temp 98.6 F (37 C) (Oral)   Resp 19   LMP 05/04/2019   SpO2 98%  I/O last 3 completed shifts: In: 53 [P.O.:300; I.V.:403] Out: 1050 [Urine:1050] Total I/O In: 420 [P.O.:20; I.V.:400] Out: 425 [Urine:425] NST: FHR baseline 150 bpm, Variability: minimal, Accelerations:present, Decelerations:  Absent= Cat 2/Reactive CTX:  irregular, every 1 in 10 minutes, lasting 30-50 seconds, not showing on toco, pt grimaces with cxt.  Uterus gravid, soft non tender, mild to palpate with contractions.  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  Initially OBS x 28 days, course as follows MgS04 completed BMZ complete 3/11, 3/12 Latency  ABX complete MFM and NICU consults completed Reevaluated at 28.0 weeks, deemed not ROM and discharged home.   She then returned 48 hours later on 4/11 with new onset vaginal bleeding She was admitted for MgSO4 and rescue BMZ, completed 4/11, 4/12   Currently on LD for preterm labor, SVE was 7/100/0 with BBOW. Progressing naturally but in prodromal labor at 6cm and stalled on magnesium and indomethacin help reduce cxts.  Mag to stop in 45 mins, vaginal bleeding noted in bedpan and glove during check. Abdomen soft non-tender.  Patient Active Problem List   Diagnosis Date Noted  . Premature cervical dilation in third trimester 11/28/2019  . Anemia affecting pregnancy in second trimester 11/02/2019  . PPROM 24 wks 10/31/2019  . Incompetent cervix during second trimester, antepartum 10/28/2019   NICHD: Category 2, on magnesium minimal variable.   Membranes:  Intact, no s/s of infection  Pain management:               IV pain management: x Declines             Epidural placement:  PRN  GBS Negative on 4/11, but DR Alesia Richards noted GBS at earlier date, therefore tx was recommended with  penicillin per Dr Alesia Richards. Last dose given at 1552 X1 dose   PTL: SVD 6/100/0, with BBOW, vertex verified via sutures, S/P Indomethacin 50mg  PO @1256 , S/P 4gm  magnesium bolus.  Bleeding: Monitor and report if increased.   Plan: Continue labor plan Continuous monitoring Rest Bedrest PTL:  currently on 2gm/hr for neruo-protectant will d/c in 45 mins and continue with 25mg  PO Q6H for 48 hours.  GBS +/-:  Prophylactic tx with penicillin.  NICU aware.  Will reassess with cervical exam at if necessary Anticipate labor progression and vaginal delivery.   Md [facility] aware of plan and verbalized agreement.   [name], NP-C, CNM, MSN 12/02/2019. 11:56 PM

## 2019-12-02 NOTE — Progress Notes (Signed)
Notified by primary RN C. Stokes, Pt. Making cervical change [facility] checked pt.) currently 5 cm, decision to move pt. To L/D. Mag gtt started on OBSC.  Call to NICU charge Marygrace Drought  Call to L/D charge Epifanio Lesches, pt. Will go to [phone] nurse will be Tempie Hoist

## 2019-12-03 ENCOUNTER — Encounter (HOSPITAL_COMMUNITY): Payer: Self-pay | Admitting: Obstetrics & Gynecology

## 2019-12-03 DIAGNOSIS — O411290 Chorioamnionitis, unspecified trimester, not applicable or unspecified: Secondary | ICD-10-CM | POA: Diagnosis not present

## 2019-12-03 DIAGNOSIS — Z3A00 Weeks of gestation of pregnancy not specified: Secondary | ICD-10-CM | POA: Diagnosis not present

## 2019-12-03 MED ORDER — TETANUS-DIPHTH-ACELL PERTUSSIS 5-2.5-18.5 LF-MCG/0.5 IM SUSP: 0.5000 mL | Freq: Once | INTRAMUSCULAR | Status: DC

## 2019-12-03 MED ORDER — DIPHENHYDRAMINE HCL 25 MG PO CAPS: 25.0000 mg | ORAL_CAPSULE | Freq: Four times a day (QID) | ORAL | Status: DC | PRN

## 2019-12-03 MED ORDER — ZOLPIDEM TARTRATE 5 MG PO TABS: 5.0000 mg | ORAL_TABLET | Freq: Every evening | ORAL | Status: DC | PRN

## 2019-12-03 MED ORDER — ACETAMINOPHEN 325 MG PO TABS: 650.0000 mg | ORAL_TABLET | ORAL | Status: DC | PRN

## 2019-12-03 MED ORDER — ONDANSETRON HCL 4 MG PO TABS: 4.0000 mg | ORAL_TABLET | ORAL | Status: DC | PRN

## 2019-12-03 MED ORDER — SIMETHICONE 80 MG PO CHEW: 80.0000 mg | CHEWABLE_TABLET | ORAL | Status: DC | PRN

## 2019-12-03 MED ORDER — ONDANSETRON HCL 4 MG/2ML IJ SOLN: 4.0000 mg | INTRAMUSCULAR | Status: DC | PRN

## 2019-12-03 MED ORDER — DIBUCAINE (PERIANAL) 1 % EX OINT: 1.0000 "application " | TOPICAL_OINTMENT | CUTANEOUS | Status: DC | PRN

## 2019-12-03 MED ORDER — IBUPROFEN 600 MG PO TABS
600.0000 mg | ORAL_TABLET | Freq: Four times a day (QID) | ORAL | Status: DC
  Administered 2019-12-03 – 2019-12-04 (×4): 600 mg via ORAL
  Filled 2019-12-03 (×4): qty 1

## 2019-12-03 MED ORDER — SENNOSIDES-DOCUSATE SODIUM 8.6-50 MG PO TABS
2.0000 | ORAL_TABLET | ORAL | Status: DC
  Administered 2019-12-04: 2 via ORAL
  Filled 2019-12-03: qty 2

## 2019-12-03 MED ORDER — BENZOCAINE-MENTHOL 20-0.5 % EX AERO
1.0000 "application " | INHALATION_SPRAY | CUTANEOUS | Status: DC | PRN
  Administered 2019-12-03: 1 via TOPICAL
  Filled 2019-12-03: qty 56

## 2019-12-03 MED ORDER — COCONUT OIL OIL
1.0000 "application " | TOPICAL_OIL | Status: DC | PRN
  Administered 2019-12-03: 1 via TOPICAL

## 2019-12-03 MED ORDER — WITCH HAZEL-GLYCERIN EX PADS: 1.0000 "application " | MEDICATED_PAD | CUTANEOUS | Status: DC | PRN

## 2019-12-03 MED ORDER — PRENATAL MULTIVITAMIN CH
1.0000 | ORAL_TABLET | Freq: Every day | ORAL | Status: DC
  Administered 2019-12-03: 1 via ORAL
  Filled 2019-12-03: qty 1

## 2019-12-03 NOTE — Lactation Note (Signed)
This note was copied from a baby's chart. Lactation Consultation Note  Patient Name: Ann Fields [id number] Date: 12/03/2019 Reason for consult: Initial assessment;NICU baby;Preterm <34wks;[place] Baby is 6 hours in the NICU.  Baby was delivered at 29.1 weeks.  Mom has pumped and hand expressed once.  Discussed colostrum and milk coming to volume.  Stressed importance of pumping and hand expressing every 3 hours.  Mom does not have a pump for home use but she does have insurance.  She will call her insurance company.  Mom informed of pump rentals through the gift shop.  Providing Breastmilk For Your Baby in NICU and Breastfeeding Consultation Services information left with mom.  No questions or concerns.  Maternal Data Has patient been taught Hand Expression?: Yes  Feeding    LATCH Score                   Interventions Interventions: DEBP  Lactation Tools Discussed/Used Initiated by:: RN Date initiated:: 12/03/19   Consult Status Consult Status: Follow-up Date: 12/04/19 Follow-up type: In-patient    Ave Filter 12/03/2019, 10:25 AM

## 2019-12-04 LAB — CBC
HCT: 32.8 % — ABNORMAL LOW (ref 36.0–46.0)
Hemoglobin: 10.6 g/dL — ABNORMAL LOW (ref 12.0–15.0)
MCH: 27.9 pg (ref 26.0–34.0)
MCHC: 32.3 g/dL (ref 30.0–36.0)
MCV: 86.3 fL (ref 80.0–100.0)
Platelets: 222 10*3/uL (ref 150–400)
RBC: 3.8 MIL/uL — ABNORMAL LOW (ref 3.87–5.11)
RDW: 14.8 % (ref 11.5–15.5)
WBC: 10.1 10*3/uL (ref 4.0–10.5)
nRBC: 0 % (ref 0.0–0.2)

## 2019-12-04 MED ORDER — IBUPROFEN 600 MG PO TABS: 600.0000 mg | ORAL_TABLET | Freq: Four times a day (QID) | ORAL | 0 refills | Status: AC

## 2019-12-04 NOTE — Lactation Note (Signed)
This note was copied from a baby's chart. Lactation Consultation Note  Patient Name: Ann Fields [id number] Date: 12/04/2019   Baby 20 hours old in NICU.  CGA [redacted]w[redacted]d. Mother is packed and hopeful to be discharged soon. She states she called insurance and hopefully has DEBP at home. She states she has manual pump from hospital also and is aware she can pump at bedside. Discussed pumping frequency, hands on pumping video and encouraged hand expression. Reviewed engorgement care.  Mother states volume has been drops.  Provided education on volume.       Maternal Data    Feeding Feeding Type: Donor Breast Milk  LATCH Score                   Interventions    Lactation Tools Discussed/Used     Consult Status      Carlye Grippe 12/04/2019, 11:05 AM

## 2019-12-04 NOTE — Discharge Summary (Signed)
SVD OB Discharge Summary     Patient Name: Ann Fields DOB: 01/21/1996 MRN: [id number]  Date of admission: 11/28/2019 Delivering MD: [name]  Date of delivery: 12/03/2019 Type of delivery: SVD  Newborn Data: Sex: Baby female Circumcision: ???, baby 54 weeks in NICU  Live born female  Birth Weight: 2 lb 15.3 oz (1340 g) APGAR: 7, 8  Newborn Delivery   Birth date/time: 12/03/2019 03:41:00 Delivery type: Vaginal, Spontaneous      Feeding: breast/pumping for now while baby in NICU Infant being discharge to home with mother in stable condition.   Admitting diagnosis: Premature cervical dilation in third trimester [O34.33] Intrauterine pregnancy: [redacted]w[redacted]d     Secondary diagnosis:  Active Problems:   Premature cervical dilation in third trimester   Normal postpartum course                                Complications: None  Suspected partial abruption                                                         Intrapartum Procedures: spontaneous vaginal delivery and GBS prophylaxis Postpartum Procedures: none Complications-Operative and Postpartum: none Augmentation: AROM   History of Present Illness: Ms. Ann Fields is a 24 y.o. female, G1P0101, who presents at [redacted]w[redacted]d weeks gestation. The patient has been followed at  [facility]  Her pregnancy has been complicated by:  Patient Active Problem List   Diagnosis Date Noted  . Normal postpartum course 12/04/2019  . Premature cervical dilation in third trimester 11/28/2019  . Anemia affecting pregnancy in second trimester 11/02/2019  . PPROM 24 wks 10/31/2019  . Incompetent cervix during second trimester, antepartum 10/28/2019    Hospital course:  Onset of Labor With Vaginal Delivery     24 y.o. yo G1P0101 at [redacted]w[redacted]d was admitted in Active Labor on 11/28/2019. Patient had an uncomplicated labor course as follows:  Membrane Rupture Time/Date: 3:20 AM ,12/03/2019   Intrapartum Procedures:  Episiotomy: None [1]                                         Lacerations:  None [1]  Patient had a delivery of a Viable infant. 12/03/2019  Information for the patient's newborn:  Ann Fields  Delivery Method: Vaginal, Spontaneous(Filed from Delivery Summary)     Pateint had an uncomplicated postpartum course.  She is ambulating, tolerating a regular diet, passing flatus, and urinating well. Patient is discharged home in stable condition on 12/04/19.  Postpartum Day # 6 : S/P NSVD due to preterm labor and delivery with suspected partial placenta abruption. . Patient up ad lib, denies syncope or dizziness. Reports consuming regular diet without issues and denies N/V. Patient reports 0 bowel movement + passing flatus.  Denies issues with urination and reports bleeding is "lighter."  Patient is pumping feeding and reports going ok while Newborn in NICU @ 29 weeks.  Desires undecided for postpartum contraception.  Pain is being appropriately managed with use of po meds.   Physical exam  Vitals:   12/03/19 1412 12/03/19 2052 12/04/19 0134 12/04/19 0719  BP: 129/88 120/78 115/65 (!) 110/56  Pulse: (!) 107 94 77 89  Resp: 20 16 16 15   Temp: 97.6 F (36.4 C) 98.9 F (37.2 C) 98.7 F (37.1 C) 97.7 F (36.5 C)  TempSrc: Oral Oral Oral Oral  SpO2: 99% 100% 99% 97%   General: alert, cooperative and no distress Lochia: appropriate Uterine Fundus: firm Perineum: intact DVT Evaluation: No evidence of DVT seen on physical exam. Negative Homan's sign. No cords or calf tenderness. No significant calf/ankle edema.  Labs: Lab Results  Component Value Date   WBC 10.1 12/04/2019   HGB 10.6 (L) 12/04/2019   HCT 32.8 (L) 12/04/2019   MCV 86.3 12/04/2019   PLT 222 12/04/2019   CMP Latest Ref Rng & Units 10/28/2019  Glucose 70 - 99 mg/dL 100(H)  BUN 6 - 20 mg/dL <5(L)  Creatinine 0.44 - 1.00 mg/dL 0.53  Sodium 135 - 145 mmol/L 138  Potassium 3.5 - 5.1 mmol/L 4.3  Chloride 98 -  111 mmol/L 108  CO2 22 - 32 mmol/L 21(L)  Calcium 8.9 - 10.3 mg/dL 8.8(L)  Total Protein 6.5 - 8.1 g/dL 6.1(L)  Total Bilirubin 0.3 - 1.2 mg/dL 0.4  Alkaline Phos 38 - 126 U/L 73  AST 15 - 41 U/L 16  ALT 0 - 44 U/L 11    Date of discharge: 12/04/2019 Discharge Diagnoses: Premature labor and Preterm dleivery  Discharge instruction: per After Visit Summary and "Baby and Me Booklet".  After visit meds:   Activity:           unrestricted and pelvic rest Advance as tolerated. Pelvic rest for 6 weeks.  Diet:                routine Medications: PNV and Ibuprofen Postpartum contraception: Undecided Condition:  Pt discharge to home  in stable condition, per pts request. Pt stable and newborn left in NICU @ 29 weeks  Meds: Allergies as of 12/04/2019      Reactions   Latex Rash      Medication List    STOP taking these medications   valACYclovir 1000 MG tablet Commonly known as: VALTREX     TAKE these medications   acetaminophen 500 MG tablet Commonly known as: TYLENOL Take 1,000 mg by mouth every 6 (six) hours as needed for mild pain or headache.   albuterol 108 (90 Base) MCG/ACT inhaler Commonly known as: VENTOLIN HFA Inhale into the lungs every 6 (six) hours as needed for wheezing or shortness of breath.   ibuprofen 600 MG tablet Commonly known as: ADVIL Take 1 tablet (600 mg total) by mouth every 6 (six) hours.   iron polysaccharides 150 MG capsule Commonly known as: NIFEREX Take 1 capsule (150 mg total) by mouth daily.   prenatal multivitamin Tabs tablet Take 1 tablet by mouth daily at 12 noon.       Discharge Follow Up:  Follow-up [facility]. Schedule an appointment as soon as possible for a visit in 6 day(s).   Specialty: Obstetrics and Gynecology Contact information: [address] [place] [phone] [place], NP-C, [place] 12/04/2019, 12:46 PM   [name], [place]

## 2019-12-06 LAB — SURGICAL PATHOLOGY

## 2020-01-03 ENCOUNTER — Ambulatory Visit: Payer: BC Managed Care – PPO

## 2020-01-03 ENCOUNTER — Ambulatory Visit: Payer: Self-pay

## 2020-01-03 NOTE — Lactation Note (Signed)
This note was copied from a baby's chart. Lactation Consultation Note  Patient Name: Ann Fields [id number] Date: 01/03/2020 Reason for consult: Follow-up assessment;Mother's request;Primapara;1st time breastfeeding;NICU baby;Infant < 6lbs;Preterm <34wks  RN called stating Mom had questions and would like a [place] visit.   Mom in recliner with baby STS on her chest.  Praised Mom for doing STS with baby.  Baby recently had to be reintubated (having intermittent apnea and bradycardia episodes) and is now on room air.  Baby is being gavage fed EBM and donor milk.     Baby Ann is 56 weeks old, and AGA [redacted]w[redacted]d.  Mom states her milk supply has dropped recently, and she is pumping 8 times in 24 hrs using her WIC pump.  Mom is very concerned as she was told her baby would have to go on formula if they needed to supplement her EBM.  Mom is feeling anxious about this.   Spent time reassuring her about her milk supply.  Mom is able to express about 30 ml each time currently.    Tips for increasing milk supply- 1- Warm compresses and breast massage prior to pumping 2-As much STS as she can with baby 3-Pumping at baby's bedside 4-"power pumping" once a day (explained this routine to Mom) 5- make sure sleeping well, but recommend waking between 2-4 am to pump. 6-Pump every 2-3 hrs during day and 3-4 hrs at night.  Encouraged Mom to call [facility] for any concerns regarding breastmilk pumping, or assistance when baby can go to the breast.   Mom appreciative of assistance.    Interventions Interventions: Breast feeding basics reviewed;Skin to skin;Breast massage;Hand express;DEBP  Lactation Tools Discussed/Used Tools: Pump Breast pump type: Double-Electric Breast Pump   Consult Status Consult Status: Follow-up Date: 01/04/20 Follow-up type: [place] 01/03/2020, 5:42 PM

## 2020-01-05 ENCOUNTER — Ambulatory Visit: Payer: Self-pay

## 2020-01-05 NOTE — Lactation Note (Signed)
This note was copied from a baby's chart. Lactation Consultation Note  Patient Name: Ann Fields [id number] Date: 01/05/2020 Reason for consult: Follow-up assessment;Primapara;1st time breastfeeding;NICU baby;Preterm <34wks;Infant < 6lbs  LC in to visit with P95 Mom of 53 week old infant in the NICU.  Baby AGA [redacted]w[redacted]d.  Mom is very interested in the 72 hr protected breastfeeding which has been presented to her.  Mom knows to inquire about [place] assistance when this starts.  Reassured Mom that baby will be needing supplementation with breastfeeding for a few more weeks, when he is more developmentally ready to transfer a full feed at the breast.    Mom has been working diligently on her milk supply, power pumped once and plans to again today.  Mom plans to double pump at baby's bedside today.  Mom is happy that donor breast milk availability to her baby will be extended.  Mom feels less anxious and encouraged regarding her milk supply.  Mom to ask for LC prn.  Interventions Interventions: DEBP;Skin to skin;Breast massage;Hand express  Lactation Tools Discussed/Used Tools: Pump Breast pump type: Double-Electric Breast Pump   Consult Status Consult Status: Follow-up Date: 01/12/20 Follow-up type: [place] 01/05/2020, 12:29 PM

## 2020-01-12 DIAGNOSIS — E559 Vitamin D deficiency, unspecified: Secondary | ICD-10-CM | POA: Diagnosis not present

## 2020-01-12 DIAGNOSIS — Z1329 Encounter for screening for other suspected endocrine disorder: Secondary | ICD-10-CM | POA: Diagnosis not present

## 2020-01-12 DIAGNOSIS — Z130 Encounter for screening for diseases of the blood and blood-forming organs and certain disorders involving the immune mechanism: Secondary | ICD-10-CM | POA: Diagnosis not present

## 2020-01-19 ENCOUNTER — Ambulatory Visit: Payer: Self-pay

## 2020-01-19 NOTE — Lactation Note (Signed)
This note was copied from a baby's chart. Lactation Consultation Note  Patient Name: Ann Fields [id number] Date: 01/19/2020 Reason for consult: Follow-up assessment;Primapara;Mother's request;NICU baby;Infant < 6lbs;Preterm <34wks;Other (Comment)(NICU RN called LC to see this mom for Breast feeding questions)  Baby is 60 weeks old  Baby had fed at 72 , so LC unable to assess for latch or fit with a nipple shield mom requested. LC explained to mom it would be better to do it all at once and size for the nipple shield at a feeding assessment to see how the baby accommodates the size of the NS.  Per mom can  Be available for the 2 pm feeding tomorrow after noon and LC placed request ont the NICU board in the [facility] office for 2 pm.  Mom described why she felt she might need a Nipple Shield and mentioned the NICU RN thought it might help due to her nipple especially on her left breast.  LC offered to assess and mom receptive . LC noted the nipple to be short shaft and mom able to compress the areola well . Appears slightly inverted to one side of the nipple.  The right nipple less short shaft and the areola compresses more than the left.  LC provided breast shells and encouraged mom to wear them 10 -15 mins prior to feeding so the Nipple Shield will fit better and the nipple will be more erect.  Mom mentioned she is pumping 1oz off each breast and keeping up with her pumping and her supply has increased. LC discussed power pumping both breast once a day . Mom expressed she was aware that would help her supply.  Mom receptive to teaching.         Maternal Data    Feeding Feeding Type: (baby had eaten at 33 , unable to assess for a Nipple Shield)  LATCH Score                   Interventions    Lactation Tools Discussed/Used     Consult Status Consult Status: Follow-up Date: 01/20/20(1400 feeding / written on the NICU board / mom aware) Follow-up type:  In-patient    [place] 01/19/2020, 6:13 PM

## 2020-01-20 ENCOUNTER — Ambulatory Visit: Payer: Self-pay

## 2020-01-20 NOTE — Lactation Note (Signed)
This note was copied from a baby's chart. Lactation Consultation Note  Patient Name: Ann Fields [id number] Date: 01/20/2020 Reason for consult: Follow-up assessment;Primapara;1st time breastfeeding;NICU baby;Late-preterm 34-36.6wks;Infant < 6lbs  LC in to assist with postioning and latching at the breast.  Baby 48 weeks old, AGA [redacted]w[redacted]d.  Mom has been pumping consistently and her supply has increased to about 60 ml total per pumping.  Praised Mom for her efforts.   Baby asleep swaddled in crib.  Encouraged as much STS as possible, to stimulate baby cueing and stimulate Mom's milk supply.    Baby sleepy and not rooting or showing cues.  Expressed milk onto nipple.  Initiated a 20 mm nipple shield, demonstrating how to apply properly to pull Mom's nipple deep into shield.  Baby opened after several minutes, just wide enough for nipple tip.  Baby began to fuss and posture that he was stressed.  Placed him on Mom's chest STS for a while.  Tried again when baby was quiet but eye slightly opened.  Baby opened for breast without the nipple shield.  He latched weakly onto areola but didn't suck more than a weak one or two.  Mom was talking gently to baby and baby relaxed.  Tried to pull on chin to open baby's mouth deeper, to facilitate a deeper latch, but baby didn't respond with sucking.    Reassured Mom that baby is acting totally normal for his maturity and size.  Encouraged as much STS, and licking at nipple.  Will follow-up Monday, 6/7.  Mom encouraged to continuing regular pumping.  Feeding Feeding Type: Breast Fed  LATCH Score Latch: Too sleepy or reluctant, no latch achieved, no sucking elicited.  Audible Swallowing: None  Type of Nipple: Everted at rest and after stimulation(short nipple shafts, right more erect than left)  Comfort (Breast/Nipple): Soft / non-tender  Hold (Positioning): Assistance needed to correctly position infant at breast and maintain latch.  LATCH Score:  5  Interventions Interventions: Breast feeding basics reviewed;Assisted with latch;Skin to skin;Breast massage;Hand express;Breast compression;Adjust position;Support pillows;Position options;Expressed milk;DEBP  Lactation Tools Discussed/Used Tools: Shells;Pump;Nipple Shields Nipple shield size: 20 Shell Type: Inverted Breast pump type: Double-Electric Breast Pump   Consult Status Consult Status: Follow-up Date: 01/21/20 Follow-up type: In-patient    Broad[place] John 01/20/2020, 2:33 PM

## 2020-01-31 ENCOUNTER — Ambulatory Visit: Payer: Self-pay

## 2020-01-31 ENCOUNTER — Ambulatory Visit: Payer: BC Managed Care – PPO | Attending: Internal Medicine

## 2020-01-31 DIAGNOSIS — Z23 Encounter for immunization: Secondary | ICD-10-CM

## 2020-01-31 NOTE — Progress Notes (Signed)
   Covid-19 Vaccination Clinic  Name:  Ann Fields    MRN: [id number] DOB: [birth year]  01/31/2020  [name] was observed post Covid-19 immunization for 15 minutes without incident. She was provided with Vaccine Information Sheet and instruction to access the V-Safe system.   [name] was instructed to call 911 with any severe reactions post vaccine: Marland Kitchen Difficulty breathing  . Swelling of face and throat  . A fast heartbeat  . A bad rash all over body  . Dizziness and weakness   Immunizations Administered    Name Date Dose VIS Date Route   Pfizer COVID-19 Vaccine 01/31/2020 11:07 AM 0.3 mL 10/13/2018 Intramuscular   Manufacturer: [employer], [employer]   Lot: [id number]   [place]: [address]-3

## 2020-01-31 NOTE — Lactation Note (Signed)
This note was copied from a baby's chart. Lactation Consultation Note  Patient Name: Ann Fields Date: 01/31/2020 Reason for consult: Follow-up assessment   LC Consult: scheduled for 14;00, Mother reports that she has just pumped her breast and obtained 1.5 ounces. Mother reports that this will be the first time that she has latched infant to the breast.   Assist mother with placing support pillow and boppy in her lap. Mother wanted  To do football hold. Infant rooting and cuing when placed close to the breast. Drops of milk expressed near his philtrum and infant opened wide. Infant latched on with a steady pattern of suckling and audible swallows. Observed several short breaks during a 16 min feeding.  Mother very excited about first feeding.   Grandmother in to visit and mother didn't want to offer the other side.  Mother placed shells back on. She reports that the left nipple is very inverted.  Advised mother to continue to do good  pumping every 2-3 hours for 15-20 mins.   Mother is aware that she can page and consult with [place] anytime.   Maternal Data    Feeding Feeding Type: Breast Fed Nipple Type: Dr. Myra Gianotti Preemie  LATCH Score Latch: Grasps breast easily, tongue down, lips flanged, rhythmical sucking.  Audible Swallowing: Spontaneous and intermittent  Type of Nipple: Everted at rest and after stimulation  Comfort (Breast/Nipple): Filling, red/small blisters or bruises, mild/mod discomfort  Hold (Positioning): Assistance needed to correctly position infant at breast and maintain latch.  LATCH Score: 8  Interventions Interventions: Assisted with latch;Skin to skin;Hand express;Breast compression;Adjust position;Support pillows;Position options;Shells  Lactation Tools Discussed/Used     Consult Status Consult Status: PRN    Darla Lesches 01/31/2020, 2:37 PM

## 2020-02-17 DIAGNOSIS — Z419 Encounter for procedure for purposes other than remedying health state, unspecified: Secondary | ICD-10-CM | POA: Diagnosis not present

## 2020-02-21 ENCOUNTER — Ambulatory Visit: Payer: BC Managed Care – PPO | Attending: Internal Medicine

## 2020-02-21 DIAGNOSIS — Z23 Encounter for immunization: Secondary | ICD-10-CM

## 2020-02-21 NOTE — Progress Notes (Signed)
   Covid-19 Vaccination Clinic  Name:  ARROW EMMERICH    MRN: [id number] DOB: [birth year]  02/21/2020  Ms. Garfinkel was observed post Covid-19 immunization for 15 minutes without incident. She was provided with Vaccine Information Sheet and instruction to access the V-Safe system.   Ms. Youngberg was instructed to call 911 with any severe reactions post vaccine: Marland Kitchen Difficulty breathing  . Swelling of face and throat  . A fast heartbeat  . A bad rash all over body  . Dizziness and weakness   Immunizations Administered    Name Date Dose VIS Date Route   Pfizer COVID-19 Vaccine 02/21/2020 10:45 AM 0.3 mL 10/13/2018 Intramuscular   Manufacturer: [employer], [employer]   Lot: [id number]   [place]: [address]-7

## 2020-03-19 DIAGNOSIS — Z419 Encounter for procedure for purposes other than remedying health state, unspecified: Secondary | ICD-10-CM | POA: Diagnosis not present

## 2020-04-19 DIAGNOSIS — Z419 Encounter for procedure for purposes other than remedying health state, unspecified: Secondary | ICD-10-CM | POA: Diagnosis not present

## 2020-05-19 DIAGNOSIS — Z419 Encounter for procedure for purposes other than remedying health state, unspecified: Secondary | ICD-10-CM | POA: Diagnosis not present

## 2020-06-19 DIAGNOSIS — Z419 Encounter for procedure for purposes other than remedying health state, unspecified: Secondary | ICD-10-CM | POA: Diagnosis not present

## 2020-07-19 DIAGNOSIS — Z419 Encounter for procedure for purposes other than remedying health state, unspecified: Secondary | ICD-10-CM | POA: Diagnosis not present

## 2020-08-19 DIAGNOSIS — Z419 Encounter for procedure for purposes other than remedying health state, unspecified: Secondary | ICD-10-CM | POA: Diagnosis not present

## 2020-09-19 DIAGNOSIS — Z419 Encounter for procedure for purposes other than remedying health state, unspecified: Secondary | ICD-10-CM | POA: Diagnosis not present

## 2020-10-17 DIAGNOSIS — Z419 Encounter for procedure for purposes other than remedying health state, unspecified: Secondary | ICD-10-CM | POA: Diagnosis not present

## 2020-11-17 DIAGNOSIS — Z419 Encounter for procedure for purposes other than remedying health state, unspecified: Secondary | ICD-10-CM | POA: Diagnosis not present

## 2020-12-17 DIAGNOSIS — Z419 Encounter for procedure for purposes other than remedying health state, unspecified: Secondary | ICD-10-CM | POA: Diagnosis not present

## 2021-01-17 DIAGNOSIS — Z419 Encounter for procedure for purposes other than remedying health state, unspecified: Secondary | ICD-10-CM | POA: Diagnosis not present

## 2021-02-16 DIAGNOSIS — Z419 Encounter for procedure for purposes other than remedying health state, unspecified: Secondary | ICD-10-CM | POA: Diagnosis not present

## 2021-03-19 DIAGNOSIS — Z419 Encounter for procedure for purposes other than remedying health state, unspecified: Secondary | ICD-10-CM | POA: Diagnosis not present

## 2021-04-19 DIAGNOSIS — Z419 Encounter for procedure for purposes other than remedying health state, unspecified: Secondary | ICD-10-CM | POA: Diagnosis not present

## 2021-05-19 DIAGNOSIS — Z419 Encounter for procedure for purposes other than remedying health state, unspecified: Secondary | ICD-10-CM | POA: Diagnosis not present

## 2021-06-19 DIAGNOSIS — Z419 Encounter for procedure for purposes other than remedying health state, unspecified: Secondary | ICD-10-CM | POA: Diagnosis not present

## 2021-06-23 IMAGING — US US MFM OB LIMITED
1 series · 13 of 28 positions shown · non-contrast
Comparison: none

[Series 1: us mfm ob limited · 50 acquisitions, 13 frames shown]
[im 2/50]
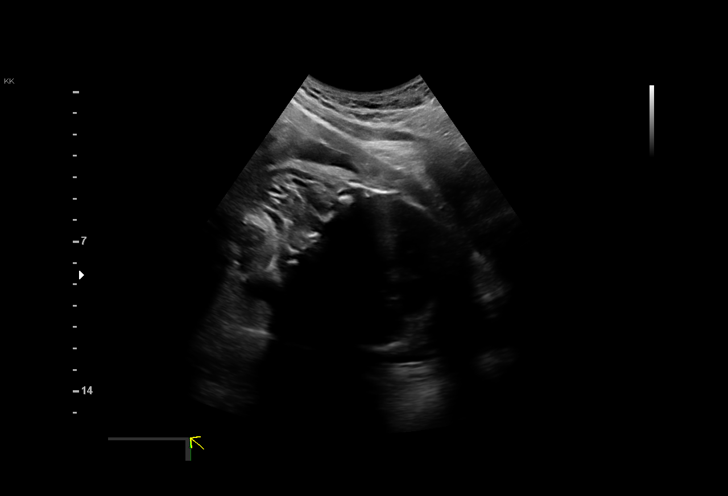
[im 6/50]
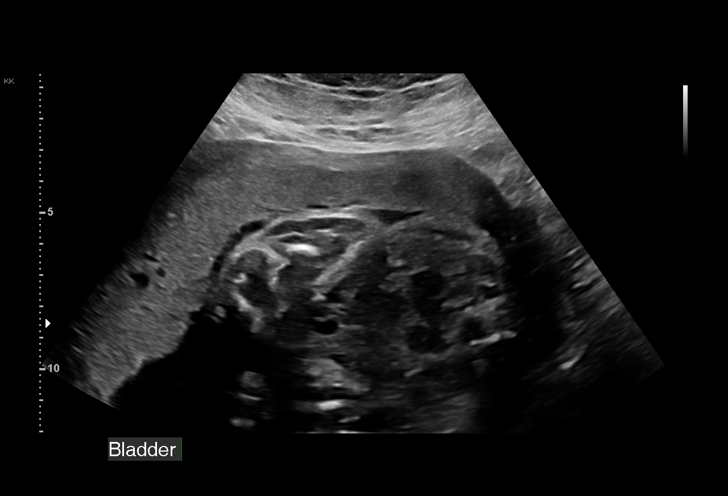
[im 10/50]
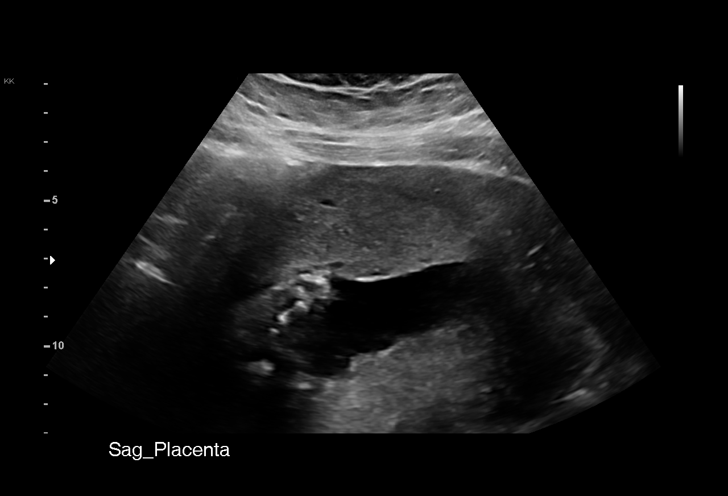
[im 13/50]
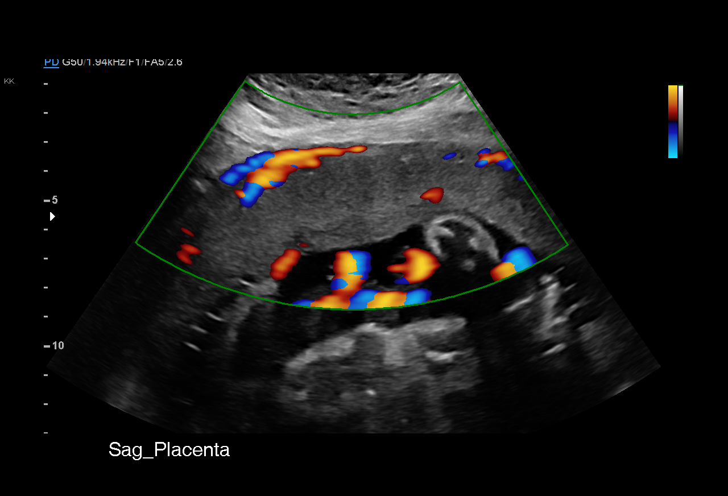
[im 17/50]
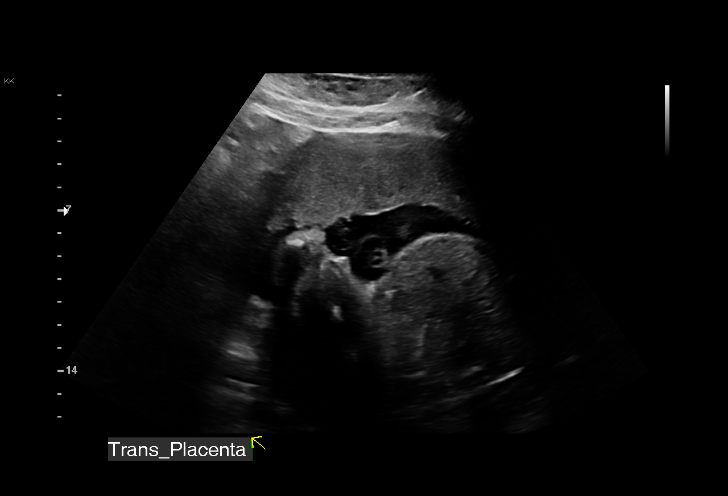
[im 20/50]
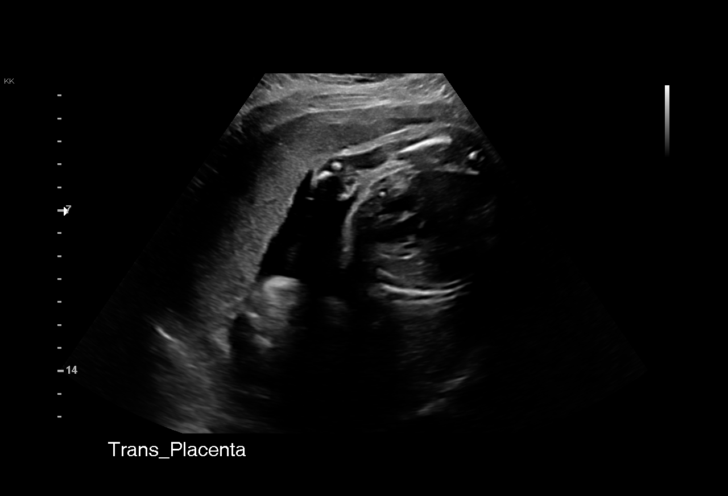
[im 26/50]
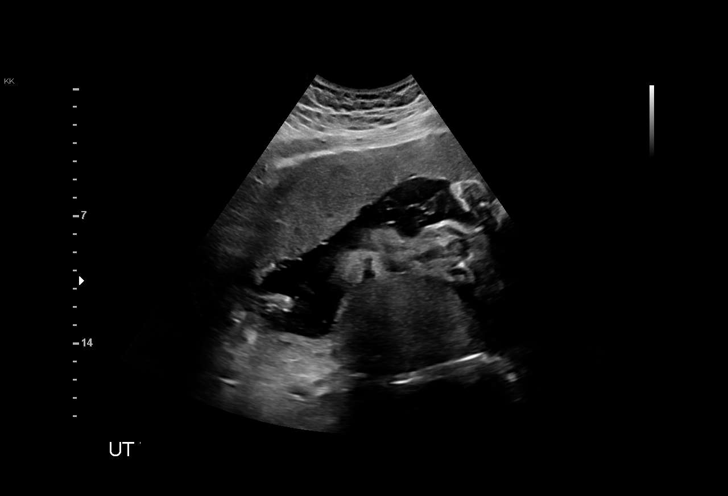
[im 30/50]
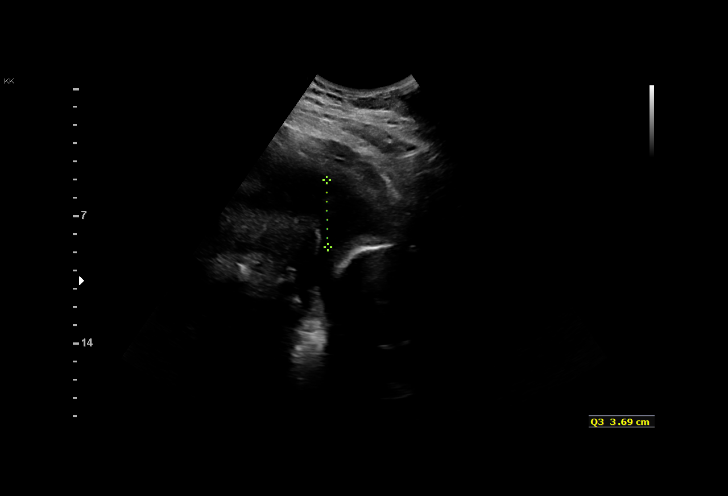
[im 33/50]
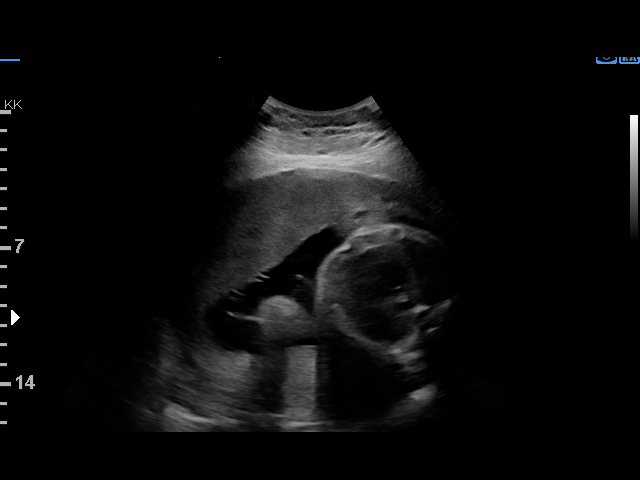
[im 37/50]
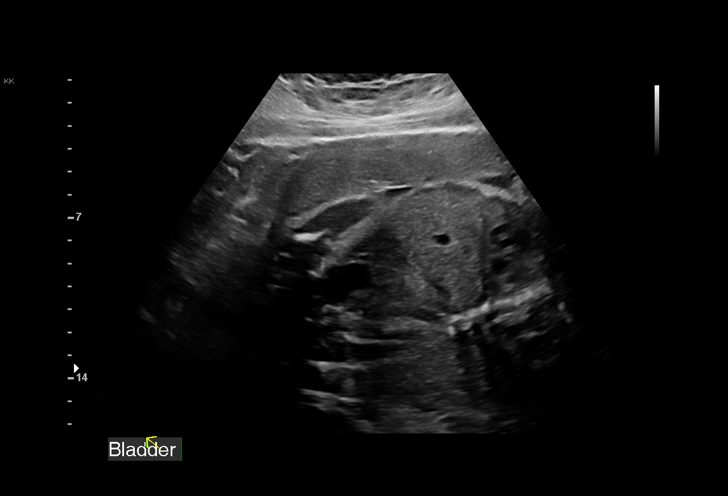
[im 40/50]
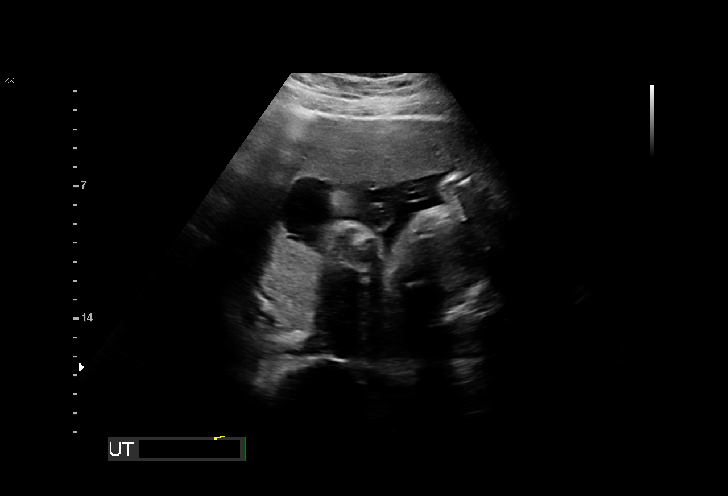
[im 44/50]
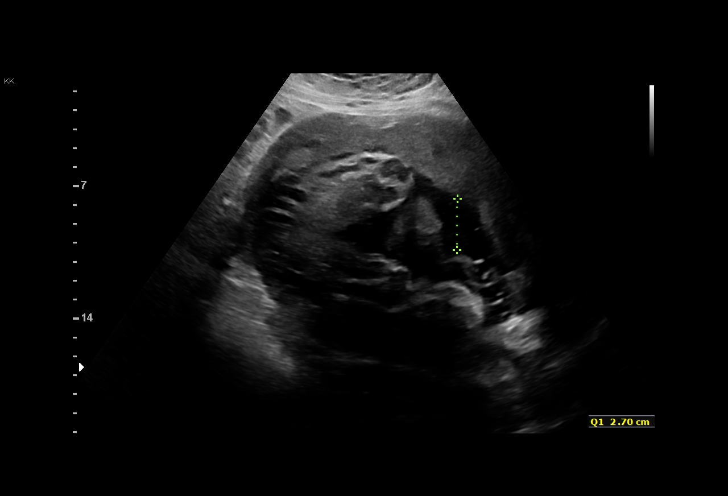
[im 48/50]
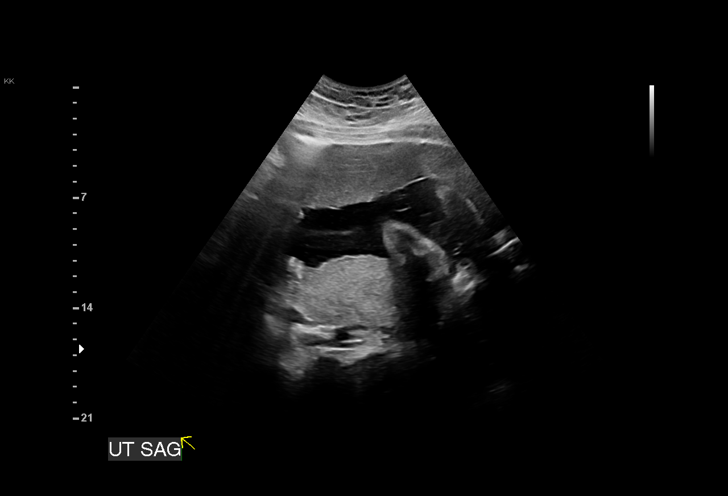

[13 of 28 positions shown; findings below may reference images not displayed]

Obstetrics &
                                                            Gynecology
                                                            [address]
                                                            [place]
                   Care                                     [HOSPITAL]

  1  US MFM OB LIMITED                    76815.01     MAITE FAZAL
 ----------------------------------------------------------------------

 ----------------------------------------------------------------------
Indications

  Premature rupture of membranes - leaking
  fluid
  Obesity complicating pregnancy, second
  trimester (BMI 38)
  Marginal insertion of umbilical cord affecting
  management of mother in second trimester
  Cervical incompetence, second trimester
  (dilation)
  28 weeks gestation of pregnancy
 ----------------------------------------------------------------------
Vital Signs

 BMI:
Fetal Evaluation

 Num Of Fetuses:         1
 Fetal Heart Rate(bpm):  137
 Cardiac Activity:       Observed
 Presentation:           Cephalic
 Placenta:               Anterior
 P. Cord Insertion:      Marginal insertion

 Amniotic Fluid
 AFI FV:      Within normal limits
 AFI Sum(cm)     %Tile       Largest Pocket(cm)
 11.57           25

 RUQ(cm)       RLQ(cm)       LUQ(cm)        LLQ(cm)

OB History

 Gravidity:    1
Gestational Age

 Best:          28w 6d     Det. By:  Early Ultrasound         EDD:   02/17/20
                                     (07/12/19)
Anatomy

 Stomach:               Visualized             Bladder:                Visualized
Cervix Uterus Adnexa

 Cervix
 Not visualized (advanced GA >21wks)
Comments

 Nomas[place]ulele Moatshe was seen for a follow-up consultation due to
 vaginal bleeding probably related to preterm
 contractions/preterm labor.  She is currently at 28 weeks and
 6 days.  The patient was previously hospitalized for about a
 month after presumed rupture of membranes.  Last week, at
 28 weeks, after the patient reported no further leakage of fluid
 and her AFI remained within normal limits, she was
 discharged home.  She then returned to the [facility]
 complaining of contractions along with vaginal bleeding two
 days later.  The patient denied any recent intercourse.  Her
 cervical exam at the time of readmission was 2 to 3 cm
 dilated and 50% effaced.  Due to vaginal bleeding and
 preterm labor, the patient received a rescue course of
 steroids and magnesium sulfate for fetal neuro protection.
 She reports that she has not experienced any further vaginal
 bleeding since yesterday.  Her contractions have also
 resolved.  Her fetal status has been reassuring.
 She had a limited ultrasound performed today that showed a
 normal-appearing anterior placenta.  There were no signs of
 placenta previa noted today.  Her AFI was within normal limits
 (11.5cm).  The fetus was in the vertex presentation.
 The patient was advised that as she has not complained of
 leakage of fluid for the past few weeks and as her AFI has
 remained stable in the normal range, that she probably did
 not rupture membranes.
 Due to preterm labor with vaginal bleeding, I would
 recommend that she remain in the hospital for 7 days
 following her last episode of vaginal bleeding.  Should her
 contractions return, a course of Indocin (50 mg p.o. x1
 followed by 25 mg p.o. every 6 hours to complete a 48-hour
 course) or nifedipine 10 to 20 mg every 4-6 hours may be
 considered for tocolysis.  Indocin should not be given at 32
 weeks or greater.  Magnesium sulfate should be given for
 fetal neuro protection should she be at risk for delivery at 32
 weeks or less.  As she has already received two courses of
 antenatal corticosteroids, no further steroid courses are
 indicated.
 The patient understands that we will have to reassess on a
 weekly basis whether or not she is stable for discharge.  The
 decision for inpatient versus outpatient management would
 be based on whether or not she continues to have vaginal
 bleeding and contractions.
 Both the patient and her partner are agreeable with the plan
 for another 7 days of observation in the hospital.
 Thank you for referring this patient for Maternal-Fetal
 Medicine consultation.
 At the end of the consultation, both the patient and her
 partner stated that all their questions have been answered to
 their complete satisfaction.

## 2021-07-19 DIAGNOSIS — Z419 Encounter for procedure for purposes other than remedying health state, unspecified: Secondary | ICD-10-CM | POA: Diagnosis not present

## 2021-08-19 DIAGNOSIS — Z419 Encounter for procedure for purposes other than remedying health state, unspecified: Secondary | ICD-10-CM | POA: Diagnosis not present

## 2021-09-19 DIAGNOSIS — Z419 Encounter for procedure for purposes other than remedying health state, unspecified: Secondary | ICD-10-CM | POA: Diagnosis not present

## 2021-10-17 DIAGNOSIS — Z419 Encounter for procedure for purposes other than remedying health state, unspecified: Secondary | ICD-10-CM | POA: Diagnosis not present

## 2021-11-17 DIAGNOSIS — Z419 Encounter for procedure for purposes other than remedying health state, unspecified: Secondary | ICD-10-CM | POA: Diagnosis not present

## 2021-12-17 DIAGNOSIS — Z419 Encounter for procedure for purposes other than remedying health state, unspecified: Secondary | ICD-10-CM | POA: Diagnosis not present

## 2022-01-17 DIAGNOSIS — Z419 Encounter for procedure for purposes other than remedying health state, unspecified: Secondary | ICD-10-CM | POA: Diagnosis not present

## 2022-02-16 DIAGNOSIS — Z419 Encounter for procedure for purposes other than remedying health state, unspecified: Secondary | ICD-10-CM | POA: Diagnosis not present

## 2022-03-19 DIAGNOSIS — Z419 Encounter for procedure for purposes other than remedying health state, unspecified: Secondary | ICD-10-CM | POA: Diagnosis not present

## 2022-04-19 DIAGNOSIS — Z419 Encounter for procedure for purposes other than remedying health state, unspecified: Secondary | ICD-10-CM | POA: Diagnosis not present

## 2022-05-19 DIAGNOSIS — Z419 Encounter for procedure for purposes other than remedying health state, unspecified: Secondary | ICD-10-CM | POA: Diagnosis not present

## 2022-06-19 DIAGNOSIS — Z419 Encounter for procedure for purposes other than remedying health state, unspecified: Secondary | ICD-10-CM | POA: Diagnosis not present

## 2022-07-19 DIAGNOSIS — Z419 Encounter for procedure for purposes other than remedying health state, unspecified: Secondary | ICD-10-CM | POA: Diagnosis not present

## 2022-08-19 DIAGNOSIS — Z419 Encounter for procedure for purposes other than remedying health state, unspecified: Secondary | ICD-10-CM | POA: Diagnosis not present

## 2022-09-12 DIAGNOSIS — Z051 Observation and evaluation of newborn for suspected infectious condition ruled out: Secondary | ICD-10-CM | POA: Diagnosis not present

## 2022-09-12 DIAGNOSIS — Z23 Encounter for immunization: Secondary | ICD-10-CM | POA: Diagnosis not present

## 2022-09-12 DIAGNOSIS — Z20818 Contact with and (suspected) exposure to other bacterial communicable diseases: Secondary | ICD-10-CM | POA: Diagnosis not present

## 2022-09-12 DIAGNOSIS — Q825 Congenital non-neoplastic nevus: Secondary | ICD-10-CM | POA: Diagnosis not present

## 2022-09-19 DIAGNOSIS — Z419 Encounter for procedure for purposes other than remedying health state, unspecified: Secondary | ICD-10-CM | POA: Diagnosis not present

## 2022-10-18 DIAGNOSIS — Z419 Encounter for procedure for purposes other than remedying health state, unspecified: Secondary | ICD-10-CM | POA: Diagnosis not present

## 2022-11-18 DIAGNOSIS — Z419 Encounter for procedure for purposes other than remedying health state, unspecified: Secondary | ICD-10-CM | POA: Diagnosis not present

## 2022-12-18 DIAGNOSIS — Z419 Encounter for procedure for purposes other than remedying health state, unspecified: Secondary | ICD-10-CM | POA: Diagnosis not present
